# Patient Record
Sex: Male | Born: 1940 | Race: White | Hispanic: No | Marital: Married | Smoking: Former smoker
Health system: Southern US, Community
[De-identification: ages and names within clinical notes are randomized; demographics above are authoritative.]

## PROBLEM LIST (undated history)

## (undated) DIAGNOSIS — M1990 Unspecified osteoarthritis, unspecified site: Secondary | ICD-10-CM

## (undated) DIAGNOSIS — R55 Syncope and collapse: Secondary | ICD-10-CM

## (undated) DIAGNOSIS — M109 Gout, unspecified: Secondary | ICD-10-CM

## (undated) DIAGNOSIS — I471 Supraventricular tachycardia, unspecified: Secondary | ICD-10-CM

## (undated) DIAGNOSIS — I4510 Unspecified right bundle-branch block: Secondary | ICD-10-CM

## (undated) DIAGNOSIS — I480 Paroxysmal atrial fibrillation: Secondary | ICD-10-CM

## (undated) DIAGNOSIS — E7800 Pure hypercholesterolemia, unspecified: Secondary | ICD-10-CM

## (undated) DIAGNOSIS — N400 Enlarged prostate without lower urinary tract symptoms: Secondary | ICD-10-CM

## (undated) DIAGNOSIS — I639 Cerebral infarction, unspecified: Secondary | ICD-10-CM

## (undated) DIAGNOSIS — I10 Essential (primary) hypertension: Secondary | ICD-10-CM

## (undated) DIAGNOSIS — K219 Gastro-esophageal reflux disease without esophagitis: Secondary | ICD-10-CM

## (undated) DIAGNOSIS — I4892 Unspecified atrial flutter: Secondary | ICD-10-CM

## (undated) DIAGNOSIS — G459 Transient cerebral ischemic attack, unspecified: Secondary | ICD-10-CM

## (undated) DIAGNOSIS — K635 Polyp of colon: Secondary | ICD-10-CM

## (undated) HISTORY — DX: Unspecified atrial flutter: I48.92

## (undated) HISTORY — DX: Gastro-esophageal reflux disease without esophagitis: K21.9

## (undated) HISTORY — DX: Supraventricular tachycardia: I47.1

## (undated) HISTORY — DX: Pure hypercholesterolemia, unspecified: E78.00

## (undated) HISTORY — DX: Transient cerebral ischemic attack, unspecified: G45.9

## (undated) HISTORY — PX: CARDIAC CATHETERIZATION: SHX172

## (undated) HISTORY — DX: Supraventricular tachycardia, unspecified: I47.10

## (undated) HISTORY — DX: Unspecified right bundle-branch block: I45.10

## (undated) HISTORY — DX: Paroxysmal atrial fibrillation: I48.0

## (undated) HISTORY — DX: Polyp of colon: K63.5

## (undated) HISTORY — DX: Syncope and collapse: R55

## (undated) HISTORY — DX: Gout, unspecified: M10.9

## (undated) HISTORY — PX: TONSILLECTOMY: SUR1361

---

## 1998-10-31 ENCOUNTER — Ambulatory Visit (HOSPITAL_COMMUNITY): Admission: RE | Admit: 1998-10-31 | Discharge: 1998-10-31 | Payer: Self-pay | Admitting: Gastroenterology

## 2001-04-04 ENCOUNTER — Inpatient Hospital Stay (HOSPITAL_COMMUNITY): Admission: EM | Admit: 2001-04-04 | Discharge: 2001-04-05 | Payer: Self-pay

## 2001-04-04 ENCOUNTER — Emergency Department (HOSPITAL_COMMUNITY): Admission: EM | Admit: 2001-04-04 | Discharge: 2001-04-04 | Payer: Self-pay

## 2001-04-11 ENCOUNTER — Ambulatory Visit (HOSPITAL_COMMUNITY): Admission: RE | Admit: 2001-04-11 | Discharge: 2001-04-11 | Payer: Self-pay | Admitting: Cardiology

## 2003-01-25 ENCOUNTER — Ambulatory Visit (HOSPITAL_COMMUNITY): Admission: RE | Admit: 2003-01-25 | Discharge: 2003-01-25 | Payer: Self-pay | Admitting: Cardiology

## 2004-01-13 ENCOUNTER — Ambulatory Visit (HOSPITAL_COMMUNITY): Admission: RE | Admit: 2004-01-13 | Discharge: 2004-01-13 | Payer: Self-pay | Admitting: Gastroenterology

## 2007-05-04 DIAGNOSIS — I639 Cerebral infarction, unspecified: Secondary | ICD-10-CM

## 2007-05-04 HISTORY — DX: Cerebral infarction, unspecified: I63.9

## 2007-11-07 ENCOUNTER — Ambulatory Visit: Payer: Self-pay | Admitting: Internal Medicine

## 2007-11-07 ENCOUNTER — Encounter: Admission: RE | Admit: 2007-11-07 | Discharge: 2007-11-07 | Payer: Self-pay | Admitting: Family Medicine

## 2007-11-07 ENCOUNTER — Inpatient Hospital Stay (HOSPITAL_COMMUNITY): Admission: EM | Admit: 2007-11-07 | Discharge: 2007-11-09 | Payer: Self-pay | Admitting: Emergency Medicine

## 2007-11-08 ENCOUNTER — Ambulatory Visit: Payer: Self-pay | Admitting: Surgery

## 2007-11-08 ENCOUNTER — Encounter: Payer: Self-pay | Admitting: Internal Medicine

## 2010-09-15 NOTE — H&P (Signed)
NAME:  Logan Aguirre, Logan Aguirre NO.:  [id number]   MEDICAL RECORD NO.:  [id number]          PATIENT TYPE:  INP   LOCATION:  3041                         FACILITY:  [facility]   PHYSICIAN:  Gordy Savers, MDDATE OF BIRTH:  05-Mar-1941   DATE OF ADMISSION:  11/07/2007  DATE OF DISCHARGE:                              HISTORY & PHYSICAL   CHIEF COMPLAINT:  Confusion.   PRESENT ILLNESS:  The patient is a 70 year old gentleman who was stable  until approximately 9:30 a.m. the day of admission at that time when  involved in a school meeting, he became acutely confused.  He apparently  became somewhat unaware of his surroundings and did not realize the  meeting had started.  According to bystanders, he appeared to be quite  confused with normal speech and no focal deficits.  Due to his  confusion, the meeting was terminated early and he was subsequently  evaluated by his primary care Emiel Kielty.  An outpatient head CT was  performed at [facility] that revealed a stroke of indeterminate  age.  Further details of the CT scan are unavailable.  Duration of the  confusion somewhat unclear, but probably lasted less than an hour.  The  patient is now admitted to the hospital for further observation and  evaluation of a suspected acute CVA.   The patient has a history of supraventricular tachycardia since  approximately 1990, and was admitted to hospital 4-5 years ago for  treatment of this tachy arrhythmia.  He has been on 81 mg of aspirin,  but has never been treated with Coumadin anticoagulation.  He is unclear  of any recent 2-D echocardiogram.  He is on verapamil, but he believes  this is for his SVT and not for blood pressure control.   PAST MEDICAL HISTORY:  As mentioned, history of SVT since 1990.  Other problems include.  1. Gastroesophageal reflux disease.  2. He has history of ongoing tobacco use that consist of approximately      2 cigars per week.  He has never  used cigarettes.  3. He was hospitalized approximately 4-5 years ago for management of      SVT.  4. 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   MEDICATIONS:  Medical regimen includes.  1. Verapamil 240 mg daily.  2. Protonix 40 mg daily.  3. Aspirin 81 mg daily.   FAMILY HISTORY:  Father died approximately at age 28, probable history  of colon cancer with history of colostomy.  Mother died in her late 30s.  Details of their health unclear, 3 brothers, 1 is status post CVA, 1  brother died of a self-inflicted gunshot wound.   SOCIAL HISTORY:  The patient is a infrequent cigar smoker as described.  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  He is married, 2 children and 4 grandchildren.   PHYSICAL EXAMINATION:  VITAL SIGNS:  Blood pressure 150/88, pulse 80 and  regular, O2 saturation  100% on supplemental oxygen.  SKIN:  Warm and dry without rash.  HEAD AND NECK EXAM:  Revealed no signs of head trauma.  Pupil responses  were normal.  EAR, NOSE AND THROAT:  Unremarkable.  NECK:  Revealed no bruits.  There is no neck vein distention.  No  adenopathy.  CHEST:  Clear.  CARDIOVASCULAR EXAM:  Revealed normal S1-S2, rhythm was regular.  No  murmurs.  ABDOMEN:  Benign, soft, nontender.  No organomegaly.  EXTREMITIES:  Revealed no edema.  Peripheral pulses intact.  NEUROLOGIC EXAM:  Revealed the patient to be alert, oriented and  appropriate with normal speech.  Cranial nerve examination revealed  normal pupil responses.  Extraocular muscles were full.  There is no  facial asymmetry.  Sensation was intact involving facial areas.  Tongue  and uvula were midline.  Shoulder shrug was normal.  Motor exam revealed  normal grip strength.  There is no drift of the outstretched arms.  Sensory exam was intact to soft touch.  Cerebellar exam revealed normal  ear and nose  testing and heel-to-shin testing.  Gait was normal.  Plantar reflexes were downgoing bilaterally.   IMPRESSION:  1. Acute confusional state with recent cerebrovascular accident.  2. History of paroxysmal supraventricular tachycardia.  3. Gastroesophageal reflux disease.   DISPOSITION:  The patient will be admitted to a telemetry setting and  observed.  He will be maintained on O2.  A brain MRI, carotid Doppler  evaluation and 2-D echocardiogram will be reviewed.  The patient will be  maintained on verapamil, but will consider adding ACE inhibition  depending on his blood pressure. We will check a lipid profile in the  morning and consider a statin therapy.      Gordy Savers, MD  Electronically Signed     PFK/MEDQ  D:  11/07/2007  T:  11/08/2007  Job:  [id number]

## 2010-09-15 NOTE — Discharge Summary (Signed)
NAME:  Logan Aguirre, Logan Aguirre NO.:  [id number]   MEDICAL RECORD NO.:  [id number]          PATIENT TYPE:  INP   LOCATION:  3041                         FACILITY:  [facility]   PHYSICIAN:  Ramiro Harvest, MD    DATE OF BIRTH:  1941-02-18   DATE OF ADMISSION:  11/07/2007  DATE OF DISCHARGE:  11/09/2007                               DISCHARGE SUMMARY   PRIMARY CARE PHYSICIAN:  Jasmine December A. Paulino Rily, MD, of [employer].   CARDIOLOGIST:  Armanda Magic, MD, of [facility].   DISCHARGE DIAGNOSES:  1. Altered mental status likely secondary to a transient ischemic      attack.  2. Supraventricular tachycardia.  3. Gastroesophageal reflux disease.   DISCHARGE MEDICATIONS:  1. Enteric-coated aspirin 325 mg p.o. daily.  2. Verapamil SR 240 mg p.o. daily.  3. Multivitamin daily.  4. Omeprazole 20 mg p.o. daily.   DISPOSITION AND FOLLOWUP:  The patient will be discharged home to follow  up with PCP in 2 weeks.  On followup, the patient will need some risk  factor modification and dietary modification for his LDL of 117.  The  patient will need his cholesterol reassessed in a few weeks per PCP.   CONSULTANTS:  None.   PROCEDURES PERFORMED:  1. A CT of the head with and without contrast was performed on November 07, 2007 that showed age-indeterminate lacunar infarct in the anterior      limb of the right internal capsule, otherwise normal noncontrast      appearance of the brain for age 70.  2. Chest x-ray obtained November 07, 2007 showed linear atelectasis with      scarring in the right middle lobe, no acute cardiopulmonary      disease, otherwise.  3. MRI and MRA of the head obtained on November 08, 2007 also showed normal      except for an old lacunar stroke in the anterior limb internal      capsule on the right.  No sign of acute or subacute lesion.  Normal      intracranial MR angiography of the large and medium-sized vessels.  4. A 2-D echo was obtained on November 08, 2007 that showed  overall left      ventricular systolic function was normal, EF 60-65%, no left      ventricular regional wall motion abnormalities, left ventricular      wall thickness was mildly increased, Doppler parameters consistent      with abnormal left ventricular relaxation.  His left ventricular      filling pattern was also seen with age, and no obvious      cardioembolic source.  Consider TEE if clinically warranted.  5. Carotid Dopplers were obtained on November 08, 2007 with preliminary      study showing bilaterally no significant plaque visualized.  No ICA      stenosis.   BRIEF HISTORY AND ADMITTING HISTORY AND PHYSICAL:  Mr. Palladino is a 70-  year-old gentleman who was stable until approximately 9:30 a.m. on the  day of admission.  At that time,  the patient was involved in a school  meeting, and he became acutely confused, he apparently became unaware of  his surroundings, and did not realize the meeting had started.  According to bystanders, he appeared to be quite confused with normal  speech and no focal deficits.  Due to his confusion, the meeting was  terminated early.  He was subsequently evaluated by his PCP.  An  outpatient head CT was performed at [facility] that revealed a  stroke of indeterminate age.  Further details of the CT scan were  unavailable at the time of admission.  The duration of confusion was  somewhat unclear, it probably lasted less than an hour.  The patient was  now admitted to the hospital for further observation and evaluation of a  suspected acute CVA.  The patient has a history of supraventricular  tachycardia since the 1990, and was admitted to the hospital 4-5 years  ago for treatment of his tachyarrhythmia.  The patient has been on 81 mg  of aspirin, but has never been treated with Coumadin anticoagulation.  He is unclear of any recent 2-D echo done.  The patient is on verapamil,  but believes this was for his SVT and not for blood pressure  control.   PHYSICAL EXAMINATION:  VITAL SIGNS:  Per admitting physician, blood  pressure 150/88, pulse of 80, and sating 100% on 2 L nasal cannula.  SKIN:  Warm and dry without a rash.  HEENT:  Normocephalic and atraumatic.  Pupils equal, round, and reactive  to light.  Extraocular movements intact.  Oropharynx was clear.  No  lesions.  No exudates.  NECK:  Supple.  No lymphadenopathy.  No bruits.  No neck vein  distention.  No adenopathy.  RESPIRATORY:  Lungs were clear to auscultation bilaterally.  No wheezes  and no rhonchi.  CARDIOVASCULAR:  Normal S1 and S2.  Rhythm was regular.  No murmurs.  ABDOMEN:  Soft, nontender, nondistended, and benign.  No organomegaly.  EXTREMITIES:  No edema.  Peripheral pulses were intact.  NEUROLOGICAL:  The patient was alert and oriented x3 with normal speech.  Cranial nerve exam revealed normal pupil response.  Extraocular muscles  were full.  No facial asymmetry.  Sensation was intact involving facial  areas.  Tongue and uvula were midline.  Shoulder shrug was normal.  Motor exam revealed normal grip strength.  No drift of outstretched  arms.  Sensory examination was intact to soft touch.  Cerebellar exam  revealed normal ear and nose testing and heel-to-shin testing.  Gait was  normal.  Plantar reflexes were downgoing bilaterally.   HOSPITAL COURSE:  Altered mental status likely secondary to a transient  ischemic attack.  The patient was admitted for a stroke workup secondary  to the indeterminate age found on the outpatient CT.  The patient was  maintained on oxygen.  Cardiac enzymes were cycled, which came back  negative.  Urinalysis was obtained, which was negative for any source of  infection.  Carotid Dopplers were obtained as stated above.  MRI and MRA  of the head was obtained, which was negative for any acute infarct.  A 2-  D echo was also obtained with normal EF and no cardioembolic source.  RPR was obtained, which was nonreactive.  B12  levels were also obtained,  which were within normal limits.  By the time the patient was admitted,  the patient was back to baseline.  The patient remained asymptomatic  throughout the hospitalization.  The patient was initially placed on  Aggrenox during the workup of his TIA.  The patient's Aggrenox will be  discontinued.  The patient will be placed on full-dose enteric-coated  aspirin 325 mg daily.  A fasting lipid panel was also obtained during  the hospitalization, which showed a total cholesterol level of 173,  triglycerides of 144, HDL of 27, and LDL of 117.  PTT of 30, PT of 13.8,  and INR of 1.0.  The patient remained stable, and back to his baseline,  improved condition by the day of discharge.  The patient will be  discharged home in stable and improved condition with risk factor  modification and to be started on enteric-coated aspirin 325 mg daily.   LABORATORY DATA:  Labs on the day of discharge, sodium 137, potassium  3.6, chloride 104, bicarb 24, BUN 16, creatinine 0.93, glucose of 114,  and a calcium of 8.9.   The patient will be discharged in stable and improved condition to  follow up with PCP as stated above.   It was a pleasure taking care of Mr. Emilian Stawicki.      Ramiro Harvest, MD  Electronically Signed     DT/MEDQ  D:  11/09/2007  T:  11/10/2007  Job:  [id number]   cc:   Emeterio Reeve, MD

## 2010-09-18 NOTE — Consult Note (Signed)
. [facility]  Patient:    Logan Aguirre, Logan Aguirre Visit Number: [id number] MRN: [id number]          Service Type: MED Location: 2000 2001 01 Attending Physician:  Rosanne Sack Dictated by:   Armanda Magic, M.D. Proc. Date: 04/05/01 Admit Date:  04/04/2001 Discharge Date: 04/05/2001   CC:         Logan Aguirre, M.D.   Consultation Report  CHIEF COMPLAINT:  Supraventricular tachycardia.  HISTORY OF PRESENT ILLNESS:  This is a 70 year old white male with a history of supraventricular tachycardia for greater than 20 years currently not on any medications. The supraventricular tachycardia was always terminated with coughing or taking a cold shower. He has had a URI and was taking Sudafed for the last the several days and developed sudden onset of palpitations at 9:15 a.m. on April 04, 2001. He went to the emergency room where he was in supraventricular tachycardia. He was given adenosine and propranolol and sent home after termination of the rhythm. At 5 p.m., yesterday afternoon, he had recurrent supraventricular tachycardia and went to the emergency room. He received adenosine 6 and 12 mg with conversion to sinus rhythm. He did have a very, very faint sensation of chest pressure which he always gets with his supraventricular tachycardia. There is no history of shortness of breath, nausea, vomiting, fever, or chills.  PAST MEDICAL HISTORY/PAST SURGICAL HISTORY:  Significant for supraventricular tachycardia on no medications for 20 years, tonsillectomy, fracture of transverse process in lumbar spine, prostatitis, gastroesophageal reflux.  ALLERGIES:  AMOXICILLIN, he gets hives.  MEDICATIONS:  Prilosec 20 mg a day.  FAMILY HISTORY:  Father died of emphysema. Mother died of multiple myeloma.  SOCIAL HISTORY:  He is married. No tobacco. Occasional alcohol use. Works at [employer].  REVIEW OF SYSTEMS:  Otherwise what is ever stated  in the HPI. Otherwise negative.  PHYSICAL EXAMINATION:  VITAL SIGNS:  Blood pressure is 138/76, heart rate is 70, respiratory rate 20.  HEENT:  Benign.  NECK:  Supple. No lymphadenopathy. No carotid bruits. Good carotid upstrokes.  LUNGS:  Clear to auscultation bilaterally.  HEART:  Regular rate and rhythm. No murmurs, rubs, or gallops. Normal S1, S2.  ABDOMEN:  Soft, nontender, nondistended. Positive bowel sounds. No hepatosplenomegaly. No pulsatile masses.  EXTREMITIES:  No cyanosis, erythema, or edema, +2 dorsalis pedis pulses bilaterally.  LABORATORY DATA:  EKG shows sinus tachycardia with right bundle branch block. Telemetry shows supraventricular tachycardia at 169 beats per minute which terminated to normal sinus rhythm.  CPK 303, 362, 258. MB is 4.3, 4.1, and 3.3. Relative index 1.4 and 1.1. All of his relative indexes were normal. Troponin 0.1, 0.03, and 0.06. White cell count 12.1, hematocrit 49.5, hemoglobin 17.1, platelet count 244. Sodium 140, potassium 4.5, chloride 110, bicarb 29, BUN 14, creatinine 1.2, glucose 119, phosphorus 4.1, magnesium 2.2.  ASSESSMENT: 1. Supraventricular tachycardia with long history of supraventricular    tachycardia on no medications. Probably triggered secondary to recent URI    with use of Sudafed. 2. Mildly elevated troponin on initial presentation. CPK is slightly elevated    and slightly elevated MB, but relative indexes are all normal. This is    consistent with tachycardia-induced enzymes leak and does not represent    underlying myocardial infarction. He has no cardiac risk factors. 3. Right bundle branch block. 4. Mild chest pressure. Again, which occurs every time he has supraventricular    tachycardia. He has no cardiac risk factors for  underlying coronary    disease.  PLAN: 1. A 2-D echocardiogram to rule out structural heart disease. 2. Adenosine Cardiolite to rule out possible underlying ischemia. The patient     wishes to have this done at home due to weather, and I think this is fine.    I think that the very mild troponin leak on the first set of enzymes    represents enzyme leak from tachycardia and not coronary ischemia. 3. Start Cardizem CD 180 mg a day. 4. Check thyroid function test. 5. Follow up with Armanda Magic, M.D. in two to three weeks. Dictated by:   Armanda Magic, M.D. Attending Physician:  Rosanne Sack DD:  04/05/01 TD:  04/05/01 Job: [id number]

## 2010-09-18 NOTE — Cardiovascular Report (Signed)
NAME:  Logan Aguirre, Logan Aguirre                        ACCOUNT NO.:  [id number]   MEDICAL RECORD NO.:  [id number]                   PATIENT TYPE:  OIB   LOCATION:  2855                                 FACILITY:  [facility]   PHYSICIAN:  Armanda Magic, M.D.                  DATE OF BIRTH:  03-23-1941   DATE OF PROCEDURE:  01/25/2003  DATE OF DISCHARGE:                              CARDIAC CATHETERIZATION   REFERRING PHYSICIAN:  Duncan Dull, M.D.   PROCEDURE:  1. Left heart catheterization.  2. Coronary angiography.  3. Left ventriculography.   OPERATOR:  Armanda Magic, M.D.   INDICATIONS:  Chest pain, shortness of breath, abnormal Cardiolite.   COMPLICATIONS:  None.   IV ACCESS:  Via right femoral artery 6-French sheath.   HISTORY:  This is a very pleasant 70 year old white male with a history of  SVT who had an episode of exertional shortness of breath and chest  discomfort.  He underwent stress Cardiolite study which revealed a partially  reversible inferior wall defect.  We had a discussion as to whether this may  be diaphragmatic attenuation versus underlying coronary disease and we are  now proceeding with heart catheterization.   PROCEDURE:  The patient is brought to the cardiac catheterization laboratory  in the fasting, nonsedated state.  Informed consent was obtained.  The  patient was connected to continuous heart rate and pulse oximetry  monitoring, intermittent blood pressure monitoring.  The right groin was  prepped and draped in the sterile fashion.  1% Xylocaine was used for local  anesthesia.  Using the modified Seldinger technique a 6-French sheath was  placed in the right femoral artery.  Under fluoroscopic guide a 6-French JL4  catheter was placed in the left coronary artery.  Multiple cineangiographic  films were taken in 30 degree RAO, 40 degree LAO views.  This catheter was  then exchanged out over a guidewire for a 6-French JR4 catheter which was  placed under  fluoroscopic guidance into the right coronary artery.  Multiple  cineangiographic films were taken in 30 degree RAO, 40 degree LAO views.  This catheter was then exchanged out over a guidewire for a 6-French angled  pigtail catheter which was placed under fluoroscopic guidance into the left  ventricular cavity.  Left ventriculography performed in a 30 degree RAO view  using a total of 30 mL of contrast at 13 mL/second showed normal LV systolic  function, no regional wall motion abnormalities.  The catheter was then  pulled back across the aortic valve with no significant gradient noted.  At  the end of procedure all catheters and sheaths were removed.  Manual  compression was performed until adequate hemostasis was obtained.  The  patient was transferred back to room in stable condition.   RESULTS:  Left main coronary artery is widely patent and trifurcates into a  left circumflex artery, ramus branch, and LAD.  The left circumflex artery is widely patent throughout its course and gives  rise to one large obtuse marginal branch which is widely patent.  The rest  of the left circumflex traverses the AV groove.   The ramus branch is a very large vessel as well and traverses the apex.  It  is widely patent throughout its course.   The LAD is widely patent throughout its proximal mid and distal portions and  extends to the apex.  It gives rise to two diagonal branches, both of which  are widely patent.   The right coronary artery gives rise in the mid portion to a large acute  marginal branch which is widely patent.  The RCA is widely patent throughout  its course to the inferior wall and bifurcates into a posterior descending  artery and posterolateral artery both of which are widely patent.   LEFT VENTRICULOGRAM:  Left ventriculography performed in the 30 degree RAO  view using a total of 30 mL of contrast at 13 mL/second showed normal LV  systolic function, EF 65%.  Aortic pressure  133/78 mmHg.  LV pressure 138/13  mmHg.  LVEDP 21 mmHg.   ASSESSMENT:  1. Noncardiac chest pain.  2. Normal coronary arteries.  3. Normal left ventricular function.  4. Supraventricular tachycardia which is stable.   PLAN:  Discharge to home after IV fluids and bed rest.  Follow up with Armanda Magic, M.D. in two to three weeks.                                               Armanda Magic, M.D.    TT/MEDQ  D:  01/25/2003  T:  01/25/2003  Job:  [id number]   cc:   Duncan Dull, M.D.  [address]  [place]  [place] [address]  Fax: [phone]

## 2010-09-18 NOTE — Op Note (Signed)
NAME:  NAZIER, NEYHART                        ACCOUNT NO.:  [id number]   MEDICAL RECORD NO.:  [id number]                   PATIENT TYPE:  AMB   LOCATION:  ENDO                                 FACILITY:  [facility]   PHYSICIAN:  James L. Malon Kindle., M.D.          DATE OF BIRTH:  12-29-1940   DATE OF PROCEDURE:  01/13/2004  DATE OF DISCHARGE:                                 OPERATIVE REPORT   PROCEDURE:  Colonoscopy.   ENDOSCOPIST:  Llana Aliment. Randa Evens, M.D.   MEDICAL DECISION MAKING:  Fentanyl 100 mcg, Versed 8 mg IV.   SCOPE:  Olympus pediatric adjustable colonoscope.   DESCRIPTION OF PROCEDURE:  The procedure had been explained to the patient  and a consent obtained.  With the patient in the left lateral decubitus  position, the Olympus pediatric adjustable colonoscope was inserted and  advanced under direct visualization.  The prep was excellent.  I was able to  reach the cecum without difficulty.  The ileocecal valve and appendiceal  orifice were seen.  The scope was withdrawn, and the cecum, ascending colon,  hepatic flexure, transverse colon, splenic flexure, descending and sigmoid  colon were seen well.  They were free of polyps or other lesions.  The scope  was withdrawn.  The patient tolerated the procedure well.   ASSESSMENT:  Strong family history of colon cancer, with negative  colonoscopy at this time - code #V16.0.   PLAN:  Will recommend yearly Hemoccult and repeat the procedure in five  years.                                               James L. Malon Kindle., M.D.    Waldron Session  D:  01/13/2004  T:  01/13/2004  Job:  [id number]   cc:   Duncan Dull, M.D.  [address]  [place]  [place] [address]  Fax: [phone]

## 2011-01-28 LAB — PROTIME-INR
INR: 1
Prothrombin Time: 13.8

## 2011-01-28 LAB — COMPREHENSIVE METABOLIC PANEL
BUN: 15
Calcium: 8.8
Glucose, Bld: 95
Total Protein: 6.2

## 2011-01-28 LAB — BASIC METABOLIC PANEL
BUN: 16
CO2: 24
Calcium: 8.9
Creatinine, Ser: 0.93
GFR calc non Af Amer: >60
Glucose, Bld: 114 — ABNORMAL HIGH
Sodium: 137

## 2011-01-28 LAB — CBC
HCT: 44.2
HCT: 46.2
Hemoglobin: 15.3
Hemoglobin: 16.2
MCHC: 34.5
MCHC: 35.1
MCV: 91.3
MCV: 92.3
Platelets: 197
RDW: 13
RDW: 13.2

## 2011-01-28 LAB — LIPID PANEL
LDL Cholesterol: 117 — ABNORMAL HIGH
VLDL: 29

## 2011-01-28 LAB — CARDIAC PANEL(CRET KIN+CKTOT+MB+TROPI)
CK, MB: 2.2
Total CK: 185
Troponin I: <0.01

## 2011-01-28 LAB — URINALYSIS, ROUTINE W REFLEX MICROSCOPIC
Glucose, UA: NEGATIVE
Hgb urine dipstick: NEGATIVE
Specific Gravity, Urine: 1.038 — ABNORMAL HIGH
Urobilinogen, UA: 1

## 2011-01-28 LAB — RPR: RPR Ser Ql: NONREACTIVE

## 2011-01-28 LAB — DIFFERENTIAL
Basophils Relative: 0
Lymphs Abs: 2.2
Monocytes Relative: 5
Neutro Abs: 5.9
Neutrophils Relative %: 69

## 2011-08-04 DIAGNOSIS — H2510 Age-related nuclear cataract, unspecified eye: Secondary | ICD-10-CM | POA: Diagnosis not present

## 2011-09-30 DIAGNOSIS — Z79899 Other long term (current) drug therapy: Secondary | ICD-10-CM | POA: Diagnosis not present

## 2011-09-30 DIAGNOSIS — Z23 Encounter for immunization: Secondary | ICD-10-CM | POA: Diagnosis not present

## 2011-09-30 DIAGNOSIS — E7800 Pure hypercholesterolemia, unspecified: Secondary | ICD-10-CM | POA: Diagnosis not present

## 2011-09-30 DIAGNOSIS — L989 Disorder of the skin and subcutaneous tissue, unspecified: Secondary | ICD-10-CM | POA: Diagnosis not present

## 2011-09-30 DIAGNOSIS — Z0000 Encounter for general adult medical examination without abnormal findings: Secondary | ICD-10-CM | POA: Diagnosis not present

## 2011-09-30 DIAGNOSIS — K219 Gastro-esophageal reflux disease without esophagitis: Secondary | ICD-10-CM | POA: Diagnosis not present

## 2012-01-22 DIAGNOSIS — Z23 Encounter for immunization: Secondary | ICD-10-CM | POA: Diagnosis not present

## 2012-02-21 DIAGNOSIS — G319 Degenerative disease of nervous system, unspecified: Secondary | ICD-10-CM | POA: Diagnosis not present

## 2012-02-21 DIAGNOSIS — Z8673 Personal history of transient ischemic attack (TIA), and cerebral infarction without residual deficits: Secondary | ICD-10-CM | POA: Diagnosis not present

## 2012-02-21 DIAGNOSIS — R404 Transient alteration of awareness: Secondary | ICD-10-CM | POA: Diagnosis not present

## 2012-02-21 DIAGNOSIS — F4489 Other dissociative and conversion disorders: Secondary | ICD-10-CM | POA: Diagnosis not present

## 2012-02-21 DIAGNOSIS — R0602 Shortness of breath: Secondary | ICD-10-CM | POA: Diagnosis not present

## 2012-02-21 DIAGNOSIS — I6529 Occlusion and stenosis of unspecified carotid artery: Secondary | ICD-10-CM | POA: Diagnosis not present

## 2012-02-21 DIAGNOSIS — I6789 Other cerebrovascular disease: Secondary | ICD-10-CM | POA: Diagnosis not present

## 2012-02-21 DIAGNOSIS — R6889 Other general symptoms and signs: Secondary | ICD-10-CM | POA: Diagnosis not present

## 2012-02-23 DIAGNOSIS — F411 Generalized anxiety disorder: Secondary | ICD-10-CM | POA: Diagnosis not present

## 2012-02-23 DIAGNOSIS — F29 Unspecified psychosis not due to a substance or known physiological condition: Secondary | ICD-10-CM | POA: Diagnosis not present

## 2012-03-01 DIAGNOSIS — I10 Essential (primary) hypertension: Secondary | ICD-10-CM | POA: Diagnosis not present

## 2012-03-01 DIAGNOSIS — F29 Unspecified psychosis not due to a substance or known physiological condition: Secondary | ICD-10-CM | POA: Diagnosis not present

## 2012-03-01 DIAGNOSIS — E785 Hyperlipidemia, unspecified: Secondary | ICD-10-CM | POA: Diagnosis not present

## 2012-03-01 DIAGNOSIS — Z8679 Personal history of other diseases of the circulatory system: Secondary | ICD-10-CM | POA: Diagnosis not present

## 2012-03-03 DIAGNOSIS — R972 Elevated prostate specific antigen [PSA]: Secondary | ICD-10-CM | POA: Diagnosis not present

## 2012-03-10 DIAGNOSIS — R972 Elevated prostate specific antigen [PSA]: Secondary | ICD-10-CM | POA: Diagnosis not present

## 2012-03-10 DIAGNOSIS — N401 Enlarged prostate with lower urinary tract symptoms: Secondary | ICD-10-CM | POA: Diagnosis not present

## 2012-03-14 DIAGNOSIS — Z79899 Other long term (current) drug therapy: Secondary | ICD-10-CM | POA: Diagnosis not present

## 2012-03-14 DIAGNOSIS — I471 Supraventricular tachycardia: Secondary | ICD-10-CM | POA: Diagnosis not present

## 2012-04-28 DIAGNOSIS — F29 Unspecified psychosis not due to a substance or known physiological condition: Secondary | ICD-10-CM | POA: Diagnosis not present

## 2012-04-28 DIAGNOSIS — I10 Essential (primary) hypertension: Secondary | ICD-10-CM | POA: Diagnosis not present

## 2012-04-28 DIAGNOSIS — E785 Hyperlipidemia, unspecified: Secondary | ICD-10-CM | POA: Diagnosis not present

## 2012-10-19 DIAGNOSIS — L723 Sebaceous cyst: Secondary | ICD-10-CM | POA: Diagnosis not present

## 2013-01-11 DIAGNOSIS — Z23 Encounter for immunization: Secondary | ICD-10-CM | POA: Diagnosis not present

## 2013-03-10 ENCOUNTER — Encounter: Payer: Self-pay | Admitting: Cardiology

## 2013-03-14 ENCOUNTER — Ambulatory Visit (INDEPENDENT_AMBULATORY_CARE_PROVIDER_SITE_OTHER): Payer: Medicare Other | Admitting: Cardiology

## 2013-03-14 ENCOUNTER — Encounter: Payer: Self-pay | Admitting: Cardiology

## 2013-03-14 VITALS — BP 120/78 | HR 74 | Ht 76.0 in | Wt 229.0 lb

## 2013-03-14 DIAGNOSIS — I4510 Unspecified right bundle-branch block: Secondary | ICD-10-CM

## 2013-03-14 DIAGNOSIS — E7800 Pure hypercholesterolemia, unspecified: Secondary | ICD-10-CM | POA: Insufficient documentation

## 2013-03-14 DIAGNOSIS — R339 Retention of urine, unspecified: Secondary | ICD-10-CM | POA: Diagnosis not present

## 2013-03-14 DIAGNOSIS — N139 Obstructive and reflux uropathy, unspecified: Secondary | ICD-10-CM | POA: Diagnosis not present

## 2013-03-14 DIAGNOSIS — R55 Syncope and collapse: Secondary | ICD-10-CM | POA: Insufficient documentation

## 2013-03-14 DIAGNOSIS — I471 Supraventricular tachycardia: Secondary | ICD-10-CM | POA: Diagnosis not present

## 2013-03-14 DIAGNOSIS — K219 Gastro-esophageal reflux disease without esophagitis: Secondary | ICD-10-CM | POA: Insufficient documentation

## 2013-03-14 DIAGNOSIS — N401 Enlarged prostate with lower urinary tract symptoms: Secondary | ICD-10-CM | POA: Diagnosis not present

## 2013-03-14 DIAGNOSIS — R972 Elevated prostate specific antigen [PSA]: Secondary | ICD-10-CM | POA: Diagnosis not present

## 2013-03-14 NOTE — Progress Notes (Signed)
  [address] [place], [place]  [address] Phone: [phone] Fax:  [phone]  Date:  03/14/2013   ID:  Logan Aguirre, Logan Aguirre 07/03/1940, MRN [id number]  PCP:  No primary provider on file.  Cardiologist:  Armanda Magic, MD   CC:  Annual followup of SVT and RBBB   History of Present Illness: Logan Aguirre is a 72 y.o. male with a history of chronic RBBB and SVT who presents today for followup.  He is doing well.  He denies any chest pain, DOE, SOB, LE edema, palpitations, dizziness or syncope.     Wt Readings from Last 3 Encounters:  03/14/13 229 lb (103.874 kg)     Past Medical History  Diagnosis Date  . GERD (gastroesophageal reflux disease)   . Gout   . Colon polyp   . 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 Paroxysmal supraventricular tachycardia     Dr Mayford Knife  . Right bundle branch block     normal coronary arteries   . Pure hypercholesterolemia   . Syncope and collapse 2009/2010    ? TIA    Current Outpatient Prescriptions  Medication Sig Dispense Refill  . aspirin EC 325 MG tablet Take 325 mg by mouth daily.      Marland Kitchen atorvastatin (LIPITOR) 10 MG tablet Take 10 mg by mouth daily.      . colchicine 0.6 MG tablet Take 0.6 mg by mouth. As needed      . Multiple Vitamin (MULTIVITAMIN) capsule Take 1 capsule by mouth daily.      Marland Kitchen omeprazole (PRILOSEC) 20 MG capsule Take 20 mg by mouth daily.      . verapamil (CALAN-SR) 240 MG CR tablet Take 240 mg by mouth at bedtime.       No current facility-administered medications for this visit.    Allergies:    Allergies  Allergen Reactions  . Amoxicillin     rash  . Shrimp [Shellfish Allergy]     Hives   . Zantac [Ranitidine Hcl]     hives    Social History:  The patient  reports that he has been smoking Cigars.  He does not have any smokeless tobacco history on file. He reports that he drinks about 1.8 ounces of alcohol per week. He reports that he does not use illicit drugs.    Family History:  The patient's family history includes COPD in his father; CVA in his brother; Colon cancer in his father; Multiple myeloma in his mother; Suicidality in his brother.   ROS:  Please see the history of present illness.      All other systems reviewed and negative.   PHYSICAL EXAM: VS:  BP 120/78  Pulse 74  Ht 6\' 4"  (1.93 m)  Wt 229 lb (103.874 kg)  BMI 27.89 kg/m2 Well nourished, well developed, in no acute distress HEENT: normal Neck: no JVD Cardiac:  normal S1, S2; RRR; no murmur Lungs:  clear to auscultation bilaterally, no wheezing, rhonchi or rales Abd: soft, nontender, no hepatomegaly Ext: no edema Skin: warm and dry Neuro:  CNs 2-12 intact, no focal abnormalities noted  EKG:  NSR with RBBB     ASSESSMENT AND PLAN:  1. SVT with no reoccurence  - continue Verapamil 2.  Chronic RBBB  Followup with me in 1 year  Signed, Armanda Magic, MD 03/14/2013 9:21 AM

## 2013-03-14 NOTE — Patient Instructions (Signed)
Your physician recommends that you continue on your current medications as directed. Please refer to the Current Medication list given to you today.  Your physician wants you to follow-up in: 1 Year with Dr Turner You will receive a reminder letter in the mail two months in advance. If you don't receive a letter, please call our office to schedule the follow-up appointment.  

## 2013-03-22 DIAGNOSIS — R339 Retention of urine, unspecified: Secondary | ICD-10-CM | POA: Diagnosis not present

## 2013-04-07 DIAGNOSIS — L509 Urticaria, unspecified: Secondary | ICD-10-CM | POA: Diagnosis not present

## 2013-04-07 DIAGNOSIS — R21 Rash and other nonspecific skin eruption: Secondary | ICD-10-CM | POA: Diagnosis not present

## 2013-04-07 DIAGNOSIS — Z8673 Personal history of transient ischemic attack (TIA), and cerebral infarction without residual deficits: Secondary | ICD-10-CM | POA: Diagnosis not present

## 2013-04-07 DIAGNOSIS — F17200 Nicotine dependence, unspecified, uncomplicated: Secondary | ICD-10-CM | POA: Diagnosis not present

## 2013-04-07 DIAGNOSIS — R209 Unspecified disturbances of skin sensation: Secondary | ICD-10-CM | POA: Diagnosis not present

## 2013-04-07 DIAGNOSIS — R5381 Other malaise: Secondary | ICD-10-CM | POA: Diagnosis not present

## 2013-04-13 ENCOUNTER — Other Ambulatory Visit: Payer: Self-pay | Admitting: General Surgery

## 2013-04-13 ENCOUNTER — Telehealth: Payer: Self-pay | Admitting: Cardiology

## 2013-04-13 MED ORDER — VERAPAMIL HCL ER 240 MG PO CP24: 240.0000 mg | ORAL_CAPSULE | Freq: Every day | ORAL | Status: DC

## 2013-04-13 NOTE — Telephone Encounter (Signed)
Walk in pt Form " can he take Verapamil" gave to [facility]

## 2013-05-08 DIAGNOSIS — J019 Acute sinusitis, unspecified: Secondary | ICD-10-CM | POA: Diagnosis not present

## 2013-05-09 ENCOUNTER — Other Ambulatory Visit: Payer: Self-pay | Admitting: General Surgery

## 2013-05-09 MED ORDER — VERAPAMIL HCL ER 240 MG PO TBCR: 240.0000 mg | EXTENDED_RELEASE_TABLET | Freq: Every day | ORAL | Status: DC

## 2013-10-03 DIAGNOSIS — M25529 Pain in unspecified elbow: Secondary | ICD-10-CM | POA: Diagnosis not present

## 2013-10-04 ENCOUNTER — Ambulatory Visit
Admission: RE | Admit: 2013-10-04 | Discharge: 2013-10-04 | Disposition: A | Discharge status: Home / Self Care | Payer: Medicare Other | Source: Ambulatory Visit | Attending: Family Medicine | Admitting: Family Medicine

## 2013-10-04 ENCOUNTER — Other Ambulatory Visit: Payer: Self-pay | Admitting: Family Medicine

## 2013-10-04 DIAGNOSIS — M25521 Pain in right elbow: Secondary | ICD-10-CM

## 2013-10-04 DIAGNOSIS — M25529 Pain in unspecified elbow: Secondary | ICD-10-CM | POA: Diagnosis not present

## 2013-10-04 DIAGNOSIS — M7989 Other specified soft tissue disorders: Secondary | ICD-10-CM | POA: Diagnosis not present

## 2013-10-05 DIAGNOSIS — N401 Enlarged prostate with lower urinary tract symptoms: Secondary | ICD-10-CM | POA: Diagnosis not present

## 2013-10-22 DIAGNOSIS — Z0000 Encounter for general adult medical examination without abnormal findings: Secondary | ICD-10-CM | POA: Diagnosis not present

## 2013-10-22 DIAGNOSIS — Z23 Encounter for immunization: Secondary | ICD-10-CM | POA: Diagnosis not present

## 2013-10-22 DIAGNOSIS — Z79899 Other long term (current) drug therapy: Secondary | ICD-10-CM | POA: Diagnosis not present

## 2013-10-22 DIAGNOSIS — K219 Gastro-esophageal reflux disease without esophagitis: Secondary | ICD-10-CM | POA: Diagnosis not present

## 2013-10-22 DIAGNOSIS — E7800 Pure hypercholesterolemia, unspecified: Secondary | ICD-10-CM | POA: Diagnosis not present

## 2013-10-22 DIAGNOSIS — I471 Supraventricular tachycardia: Secondary | ICD-10-CM | POA: Diagnosis not present

## 2013-10-22 DIAGNOSIS — Z131 Encounter for screening for diabetes mellitus: Secondary | ICD-10-CM | POA: Diagnosis not present

## 2013-11-14 DIAGNOSIS — M25529 Pain in unspecified elbow: Secondary | ICD-10-CM | POA: Diagnosis not present

## 2014-01-19 DIAGNOSIS — Z23 Encounter for immunization: Secondary | ICD-10-CM | POA: Diagnosis not present

## 2014-02-06 ENCOUNTER — Other Ambulatory Visit: Payer: Self-pay | Admitting: Gastroenterology

## 2014-02-06 DIAGNOSIS — K648 Other hemorrhoids: Secondary | ICD-10-CM | POA: Diagnosis not present

## 2014-02-06 DIAGNOSIS — Z09 Encounter for follow-up examination after completed treatment for conditions other than malignant neoplasm: Secondary | ICD-10-CM | POA: Diagnosis not present

## 2014-02-06 DIAGNOSIS — Z86010 Personal history of colonic polyps: Secondary | ICD-10-CM | POA: Diagnosis not present

## 2014-02-06 DIAGNOSIS — K621 Rectal polyp: Secondary | ICD-10-CM | POA: Diagnosis not present

## 2014-03-11 ENCOUNTER — Ambulatory Visit (INDEPENDENT_AMBULATORY_CARE_PROVIDER_SITE_OTHER): Payer: Medicare Other | Admitting: Cardiology

## 2014-03-11 ENCOUNTER — Encounter: Payer: Self-pay | Admitting: Cardiology

## 2014-03-11 VITALS — BP 116/78 | HR 78 | Ht 75.5 in | Wt 227.0 lb

## 2014-03-11 DIAGNOSIS — I471 Supraventricular tachycardia: Secondary | ICD-10-CM | POA: Diagnosis not present

## 2014-03-11 DIAGNOSIS — T733XXA Exhaustion due to excessive exertion, initial encounter: Secondary | ICD-10-CM | POA: Diagnosis not present

## 2014-03-11 DIAGNOSIS — I4510 Unspecified right bundle-branch block: Secondary | ICD-10-CM | POA: Diagnosis not present

## 2014-03-11 NOTE — Patient Instructions (Signed)
Your physician recommends that you continue on your current medications as directed. Please refer to the Current Medication list given to you today.  Your physician has requested that you have a lexiscan myoview. For further information please visit [email or web address]. Please follow instruction sheet, as given.  Your physician wants you to follow-up in: 1 year with Dr. Radford Pax.  You will receive a reminder letter in the mail two months in advance. If you don't receive a letter, please call our office to schedule the follow-up appointment.

## 2014-03-11 NOTE — Progress Notes (Signed)
[address], [place], [place]  [address] Phone: [phone] Fax:  [phone]  Date:  03/11/2014   ID:  Logan, Aguirre 1940-08-11, MRN [id number]  PCP:  No primary care provider on file.  Cardiologist:  Fransico Him, MD    History of Present Illness: Logan Aguirre is a 73 y.o. male with a history of chronic RBBB and SVT who presents today for followup. He is doing well. He denies any chest pain, DOE, SOB, LE edema, palpitations, dizziness or syncope. His only complaint is that he wears out easy with exertion.  He now has a harder time doing housework without getting worn out.    Wt Readings from Last 3 Encounters:  03/11/14 227 lb (102.967 kg)  03/14/13 229 lb (103.874 kg)     Past Medical History  Diagnosis Date  . GERD (gastroesophageal reflux disease)   . Gout   . Colon polyp   . 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 Paroxysmal supraventricular tachycardia     Dr Radford Pax  . Right bundle branch block     normal coronary arteries   . Pure hypercholesterolemia   . Syncope and collapse 2009/2010    ? TIA    Current Outpatient Prescriptions  Medication Sig Dispense Refill  . aspirin EC 325 MG tablet Take 325 mg by mouth daily.    Marland Kitchen atorvastatin (LIPITOR) 10 MG tablet Take 10 mg by mouth daily.    . colchicine 0.6 MG tablet Take 0.6 mg by mouth daily as needed (gout).     . finasteride (PROSCAR) 5 MG tablet Take 1 tablet by mouth daily.  3  . Multiple Vitamin (MULTIVITAMIN) capsule Take 1 capsule by mouth daily.    Marland Kitchen omeprazole (PRILOSEC) 20 MG capsule Take 20 mg by mouth daily.    . tamsulosin (FLOMAX) 0.4 MG CAPS capsule Take 1 capsule by mouth daily.  3  . verapamil (CALAN-SR) 240 MG CR tablet Take 1 tablet (240 mg total) by mouth at bedtime. 90 tablet 3   No current facility-administered medications for this visit.    Allergies:    Allergies  Allergen Reactions  . Amoxicillin     rash  . Shrimp [Shellfish Allergy]       Hives   . Zantac [Ranitidine Hcl]     hives    Social History:  The patient  reports that he has been smoking Cigars.  He does not have any smokeless tobacco history on file. He reports that he drinks about 1.8 oz of alcohol per week. He reports that he does not use illicit drugs.   Family History:  The patient's family history includes COPD in his father; CVA in his brother; Colon cancer in his father; Multiple myeloma in his mother; Suicidality in his brother.   ROS:  Please see the history of present illness.      All other systems reviewed and negative.   PHYSICAL EXAM: VS:  BP 116/78 mmHg  Pulse 78  Ht 6' 3.5" (1.918 m)  Wt 227 lb (102.967 kg)  BMI 27.99 kg/m2 Well nourished, well developed, in no acute distress HEENT: normal Neck: no JVD Cardiac:  normal S1, S2; RRR; no murmur Lungs:  clear to auscultation bilaterally, no wheezing, rhonchi or rales Abd: soft, nontender, no hepatomegaly Ext: no edema Skin: warm and dry Neuro:  CNs 2-12 intact, no focal abnormalities noted  EKG:     NSR  with RBBB  ASSESSMENT AND PLAN:  1. SVT with no reoccurence - continue Verapamil   2. Chronic RBBB   3.  Exertional fatigue - I will get a Lexiscan myoview to rule out ischemia  Followup with me in 1 year   Signed, Fransico Him, MD [facility] 03/11/2014 9:01 AM

## 2014-03-13 ENCOUNTER — Ambulatory Visit (HOSPITAL_COMMUNITY): Payer: Medicare Other | Attending: Internal Medicine | Admitting: Radiology

## 2014-03-13 DIAGNOSIS — R002 Palpitations: Secondary | ICD-10-CM | POA: Insufficient documentation

## 2014-03-13 DIAGNOSIS — I4510 Unspecified right bundle-branch block: Secondary | ICD-10-CM | POA: Diagnosis not present

## 2014-03-13 DIAGNOSIS — T733XXA Exhaustion due to excessive exertion, initial encounter: Secondary | ICD-10-CM

## 2014-03-13 DIAGNOSIS — R5383 Other fatigue: Secondary | ICD-10-CM | POA: Diagnosis not present

## 2014-03-13 DIAGNOSIS — I471 Supraventricular tachycardia: Secondary | ICD-10-CM

## 2014-03-13 MED ORDER — TECHNETIUM TC 99M SESTAMIBI GENERIC - CARDIOLITE
11.0000 | Freq: Once | INTRAVENOUS | Status: AC | PRN
  Administered 2014-03-13: 11 via INTRAVENOUS

## 2014-03-13 MED ORDER — REGADENOSON 0.4 MG/5ML IV SOLN
0.4000 mg | Freq: Once | INTRAVENOUS | Status: AC
  Administered 2014-03-13: 0.4 mg via INTRAVENOUS

## 2014-03-13 MED ORDER — TECHNETIUM TC 99M SESTAMIBI GENERIC - CARDIOLITE
33.0000 | Freq: Once | INTRAVENOUS | Status: AC | PRN
  Administered 2014-03-13: 33 via INTRAVENOUS

## 2014-03-13 NOTE — Progress Notes (Signed)
[facility] [address] [place], [place] [address] [phone]    Cardiology Nuclear Med Study  Logan Aguirre is a 73 y.o. male     MRN : [id number]     DOB: April 13, 1941  Procedure Date: 03/13/2014  Nuclear Med Background Indication for Stress Test:  Evaluation for Ischemia History:  12/02 MPI: NL EF: 70%, PSVT Cardiac Risk Factors: RBBB  Symptoms:  Fatigue and Palpitations   Nuclear Pre-Procedure Caffeine/Decaff Intake:  None NPO After: 7:30pm   Lungs:  clear O2 Sat: 97% on room air. IV 0.9% NS with Angio Cath:  22g  IV Site: R Hand  IV Started by:  Crissie Figures, RN  Chest Size (in):  44 Cup Size: n/a  Height: 6' 3.5" (1.918 m)  Weight:  224 lb (101.606 kg)  BMI:  Body mass index is 27.62 kg/(m^2). Tech Comments:  N/A    Nuclear Med Study 1 or 2 day study: 1 day  Stress Test Type:  Lexiscan  Reading MD: N/A  Order Authorizing Provider:  Fransico Him, MD  Resting Radionuclide: Technetium 74m Sestamibi  Resting Radionuclide Dose: 11.0 mCi   Stress Radionuclide:  Technetium 67m Sestamibi  Stress Radionuclide Dose: 33.0 mCi           Stress Protocol Rest HR: 66 Stress HR: 108  Rest BP: 134/78 Stress BP: 145/70  Exercise Time (min): n/a METS: n/a   Predicted Max HR: 145 bpm % Max HR: 74.48 bpm Rate Pressure Product: 15660   Dose of Adenosine (mg):  n/a Dose of Lexiscan: 0.4 mg  Dose of Atropine (mg): n/a Dose of Dobutamine: n/a mcg/kg/min (at max HR)  Stress Test Technologist: Perrin Maltese, EMT-P  Nuclear Technologist:  Earl Many, CNMT     Rest Procedure:  Myocardial perfusion imaging was performed at rest 45 minutes following the intravenous administration of Technetium 67m Sestamibi. Rest ECG: NSR-RBBB  Stress Procedure:  The patient received IV Lexiscan 0.4 mg over 15-seconds.  Technetium 19m Sestamibi injected at 30-seconds. This patient had abdominal fluttering and felt funny with the Lexiscan injection. Quantitative  spect images were obtained after a 45 minute delay. Stress ECG: No significant change from baseline ECG  QPS Raw Data Images:  Normal; no motion artifact; normal heart/lung ratio. Stress Images:  Medium-sized, moderate inferior perfusion defect. Rest Images:  Medium-sized, moderate inferior perfusion defect.  Subtraction (SDS):  Partially reversible medium-sized, moderate inferior perfusion defect.  Transient Ischemic Dilatation (Normal <1.22):  1.03 Lung/Heart Ratio (Normal <0.45):  0.31  Quantitative Gated Spect Images QGS EDV:  108 ml QGS ESV:  51 ml  Impression Exercise Capacity:  Lexiscan with no exercise. BP Response:  Normal blood pressure response. Clinical Symptoms:  "funny feeling" ECG Impression:  No significant ST segment change suggestive of ischemia. Comparison with Prior Nuclear Study: No images to compare  Overall Impression:  Intermediate risk stress nuclear study with a partially reversible, medium-sized, moderate intensity inferior perfusion defect.  Normal inferior wall motion suggests that this could be diaphragmatic attenuation, but given some reversibility, I cannot rule out an area of infarction with peri-infarct ischemia. .  LV Ejection Fraction: 52%.  LV Wall Motion:  NL LV Function; NL Wall Motion   Loralie Champagne 03/13/2014

## 2014-03-18 ENCOUNTER — Other Ambulatory Visit: Payer: Self-pay | Admitting: Cardiology

## 2014-03-18 ENCOUNTER — Ambulatory Visit (INDEPENDENT_AMBULATORY_CARE_PROVIDER_SITE_OTHER): Payer: Medicare Other | Admitting: Cardiology

## 2014-03-18 VITALS — BP 120/74 | HR 75 | Ht 75.0 in | Wt 226.0 lb

## 2014-03-18 DIAGNOSIS — I471 Supraventricular tachycardia: Secondary | ICD-10-CM | POA: Diagnosis not present

## 2014-03-18 DIAGNOSIS — Z01812 Encounter for preprocedural laboratory examination: Secondary | ICD-10-CM

## 2014-03-18 DIAGNOSIS — R9439 Abnormal result of other cardiovascular function study: Secondary | ICD-10-CM | POA: Insufficient documentation

## 2014-03-18 DIAGNOSIS — T733XXA Exhaustion due to excessive exertion, initial encounter: Secondary | ICD-10-CM

## 2014-03-18 DIAGNOSIS — R931 Abnormal findings on diagnostic imaging of heart and coronary circulation: Secondary | ICD-10-CM | POA: Diagnosis not present

## 2014-03-18 HISTORY — DX: Exhaustion due to excessive exertion, initial encounter: T73.3XXA

## 2014-03-18 LAB — CBC WITH DIFFERENTIAL/PLATELET
BASOS ABS: 0 10*3/uL (ref 0.0–0.1)
Basophils Relative: 0.5 % (ref 0.0–3.0)
EOS ABS: 0.1 10*3/uL (ref 0.0–0.7)
Eosinophils Relative: 2 % (ref 0.0–5.0)
HCT: 45.7 % (ref 39.0–52.0)
HEMOGLOBIN: 15.4 g/dL (ref 13.0–17.0)
LYMPHS PCT: 23.2 % (ref 12.0–46.0)
Lymphs Abs: 1.7 10*3/uL (ref 0.7–4.0)
MCHC: 33.6 g/dL (ref 30.0–36.0)
MCV: 91.1 fl (ref 78.0–100.0)
MONOS PCT: 4.4 % (ref 3.0–12.0)
Monocytes Absolute: 0.3 10*3/uL (ref 0.1–1.0)
NEUTROS ABS: 5 10*3/uL (ref 1.4–7.7)
NEUTROS PCT: 69.9 % (ref 43.0–77.0)
Platelets: 200 10*3/uL (ref 150.0–400.0)
RBC: 5.01 Mil/uL (ref 4.22–5.81)
RDW: 13.6 % (ref 11.5–15.5)
WBC: 7.2 10*3/uL (ref 4.0–10.5)

## 2014-03-18 LAB — BASIC METABOLIC PANEL
BUN: 21 mg/dL (ref 6–23)
CALCIUM: 8.8 mg/dL (ref 8.4–10.5)
CO2: 23 mEq/L (ref 19–32)
CREATININE: 1.1 mg/dL (ref 0.4–1.5)
Chloride: 109 mEq/L (ref 96–112)
GFR: 70.04 mL/min (ref >60.00)
GLUCOSE: 119 mg/dL — AB (ref 70–99)
Potassium: 4.2 mEq/L (ref 3.5–5.1)
SODIUM: 144 meq/L (ref 135–145)

## 2014-03-18 LAB — PROTIME-INR
INR: 1 ratio (ref 0.8–1.0)
PROTHROMBIN TIME: 10.9 s (ref 9.6–13.1)

## 2014-03-18 MED ORDER — SODIUM CHLORIDE 0.9 % IV SOLN: INTRAVENOUS | Status: DC

## 2014-03-18 MED ORDER — PREDNISONE 20 MG PO TABS: ORAL_TABLET | ORAL | Status: DC

## 2014-03-18 NOTE — Patient Instructions (Addendum)
Your physician has requested that you have a cardiac catheterization. Cardiac catheterization is used to diagnose and/or treat various heart conditions. Doctors may recommend this procedure for a number of different reasons. The most common reason is to evaluate chest pain. Chest pain can be a symptom of coronary artery disease (CAD), and cardiac catheterization can show whether plaque is narrowing or blocking your heart's arteries. This procedure is also used to evaluate the valves, as well as measure the blood flow and oxygen levels in different parts of your heart. For further information please visit [email or web address]. Please follow instruction sheet, as given.  Your physician recommends that you have lab work TODAY (CBC, BMET, PT, PTT, INR)  You have a ONE TIME prescription for 60 mg Prednisone to take at 7 PM tomorrow night (03/19/14)

## 2014-03-18 NOTE — Progress Notes (Signed)
[address] [place], [place]  [address] Phone: [phone] Fax:  [phone]  Date:  03/18/2014   ID:  Sevag W Pilkington, DOB 05/17/1940, MRN [id number]  PCP:  No primary care provider on file.  Cardiologist:  Zaria Taha, MD    History of Present Illness: Logan Aguirre is a 73 y.o. male with a history of chronic RBBB and SVT who presents today for followup.When I last saw him he was complaining that he wears out easy with exertion and it is harder doing housework without getting worn out.  He underwent nuclear stress test which showed an intermediate risk stress nuclear study with a partially reversible, medium-sized, moderate intensity inferior perfusion defect.  LV Ejection Fraction: 52%. He now presents back today for discussion of results.   Wt Readings from Last 3 Encounters:  03/18/14 226 lb (102.513 kg)  03/13/14 224 lb (101.606 kg)  03/11/14 227 lb (102.967 kg)     Past Medical History  Diagnosis Date  . GERD (gastroesophageal reflux disease)   . Gout   . Colon polyp   . 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 Paroxysmal supraventricular tachycardia     Dr Jaleen Finch  . Right bundle branch block     normal coronary arteries   . Pure hypercholesterolemia   . Syncope and collapse 2009/2010    ? TIA    Current Outpatient Prescriptions  Medication Sig Dispense Refill  . aspirin EC 325 MG tablet Take 325 mg by mouth daily.    . atorvastatin (LIPITOR) 10 MG tablet Take 10 mg by mouth daily.    . colchicine 0.6 MG tablet Take 0.6 mg by mouth daily as needed (gout).     . finasteride (PROSCAR) 5 MG tablet Take 1 tablet by mouth daily.  3  . Multiple Vitamin (MULTIVITAMIN) capsule Take 1 capsule by mouth daily.    . omeprazole (PRILOSEC) 20 MG capsule Take 20 mg by mouth daily.    . tamsulosin (FLOMAX) 0.4 MG CAPS capsule Take 1 capsule by mouth daily.  3  . verapamil (CALAN-SR) 240 MG CR tablet Take 1 tablet (240 mg total) by mouth at  bedtime. 90 tablet 3   No current facility-administered medications for this visit.    Allergies:    Allergies  Allergen Reactions  . Amoxicillin     rash  . Shrimp [Shellfish Allergy]     Hives   . Zantac [Ranitidine Hcl]     hives    Social History:  The patient  reports that he has been smoking Cigars.  He does not have any smokeless tobacco history on file. He reports that he drinks about 1.8 oz of alcohol per week. He reports that he does not use illicit drugs.   Family History:  The patient's family history includes COPD in his father; CVA in his brother; Colon cancer in his father; Multiple myeloma in his mother; Suicidality in his brother.   ROS:  Please see the history of present illness.      All other systems reviewed and negative.   PHYSICAL EXAM: VS:  BP 120/74 mmHg  Pulse 75  Ht 6' 3" (1.905 m)  Wt 226 lb (102.513 kg)  BMI 28.25 kg/m2 Well nourished, well developed, in no acute distress HEENT: normal Neck: no JVD Cardiac:  normal S1, S2; RRR; no murmur Lungs:  clear to auscultation bilaterally, no wheezing, rhonchi or rales Abd: soft, nontender,   no hepatomegaly Ext: no edema Skin: warm and dry Neuro:  CNs 2-12 intact, no focal abnormalities noted  ASSESSMENT AND PLAN:  1. SVT with no reoccurence - continue Verapamil  2. Chronic RBBB  3. Exertional fatigue with abnormal Lexiscan myoveiw with a partially reversible, medium-sized, moderate intensity inferior perfusion defect.I have reviewed the findings of the cath with the patient and his wife.  He continues to have exertional fatigue.  He had a cath in 2004 that was normal.  His exertional fatigue has persisted so I have recommended that we proceed with left heart cath to evaluate coronary anatomy.  Cardiac catheterization was discussed with the patient fully including risks on myocardial infarction, death, stroke, bleeding, arrhythmia, dye allergy, renal insufficiency or bleeding.  All  patient questions and concerns were discussed and the patient understands and is willing to proceed.  We will set him up for cath tomorrow.   Signed, Fransico Him, MD [facility] 03/18/2014 10:20 AM

## 2014-03-20 ENCOUNTER — Encounter (HOSPITAL_COMMUNITY)
Admission: RE | Disposition: A | Discharge status: Home / Self Care | Payer: Self-pay | Source: Ambulatory Visit | Attending: Cardiovascular Disease

## 2014-03-20 ENCOUNTER — Ambulatory Visit (HOSPITAL_COMMUNITY)
Admission: RE | Admit: 2014-03-20 | Discharge: 2014-03-20 | Disposition: A | Discharge status: Home / Self Care | Payer: Medicare Other | Source: Ambulatory Visit | Attending: Cardiovascular Disease | Admitting: Cardiovascular Disease

## 2014-03-20 DIAGNOSIS — Z8673 Personal history of transient ischemic attack (TIA), and cerebral infarction without residual deficits: Secondary | ICD-10-CM | POA: Diagnosis not present

## 2014-03-20 DIAGNOSIS — R9439 Abnormal result of other cardiovascular function study: Secondary | ICD-10-CM | POA: Insufficient documentation

## 2014-03-20 DIAGNOSIS — I471 Supraventricular tachycardia: Secondary | ICD-10-CM | POA: Insufficient documentation

## 2014-03-20 DIAGNOSIS — Z7982 Long term (current) use of aspirin: Secondary | ICD-10-CM | POA: Diagnosis not present

## 2014-03-20 DIAGNOSIS — F17290 Nicotine dependence, other tobacco product, uncomplicated: Secondary | ICD-10-CM | POA: Insufficient documentation

## 2014-03-20 DIAGNOSIS — K219 Gastro-esophageal reflux disease without esophagitis: Secondary | ICD-10-CM | POA: Insufficient documentation

## 2014-03-20 DIAGNOSIS — E780 Pure hypercholesterolemia: Secondary | ICD-10-CM | POA: Insufficient documentation

## 2014-03-20 DIAGNOSIS — I4510 Unspecified right bundle-branch block: Secondary | ICD-10-CM | POA: Insufficient documentation

## 2014-03-20 DIAGNOSIS — R5383 Other fatigue: Secondary | ICD-10-CM | POA: Diagnosis not present

## 2014-03-20 HISTORY — PX: LEFT HEART CATHETERIZATION WITH CORONARY ANGIOGRAM: SHX5451

## 2014-03-20 SURGERY — LEFT HEART CATHETERIZATION WITH CORONARY ANGIOGRAM
Anesthesia: LOCAL

## 2014-03-20 MED ORDER — VERAPAMIL HCL 2.5 MG/ML IV SOLN
INTRAVENOUS | Status: AC
  Filled 2014-03-20: qty 2

## 2014-03-20 MED ORDER — NITROGLYCERIN 1 MG/10 ML FOR IR/CATH LAB
INTRA_ARTERIAL | Status: AC
  Filled 2014-03-20: qty 10

## 2014-03-20 MED ORDER — PREDNISONE 20 MG PO TABS
ORAL_TABLET | ORAL | Status: AC
  Administered 2014-03-20: 60 mg via ORAL
  Filled 2014-03-20: qty 3

## 2014-03-20 MED ORDER — LIDOCAINE HCL (PF) 1 % IJ SOLN
INTRAMUSCULAR | Status: AC
  Filled 2014-03-20: qty 30

## 2014-03-20 MED ORDER — DIPHENHYDRAMINE HCL 50 MG/ML IJ SOLN
INTRAMUSCULAR | Status: AC
  Administered 2014-03-20: 25 mg via INTRAVENOUS
  Filled 2014-03-20: qty 1

## 2014-03-20 MED ORDER — PREDNISONE 20 MG PO TABS
60.0000 mg | ORAL_TABLET | ORAL | Status: AC
  Administered 2014-03-20: 60 mg via ORAL

## 2014-03-20 MED ORDER — PREDNISONE 20 MG PO TABS: 60.0000 mg | ORAL_TABLET | ORAL | Status: DC

## 2014-03-20 MED ORDER — MIDAZOLAM HCL 2 MG/2ML IJ SOLN
INTRAMUSCULAR | Status: AC
  Filled 2014-03-20: qty 2

## 2014-03-20 MED ORDER — ASPIRIN 81 MG PO CHEW
81.0000 mg | CHEWABLE_TABLET | ORAL | Status: AC
  Administered 2014-03-20: 81 mg via ORAL

## 2014-03-20 MED ORDER — SODIUM CHLORIDE 0.9 % IV SOLN
250.0000 mL | INTRAVENOUS | Status: DC | PRN
  Administered 2014-03-20: 250 mL via INTRAVENOUS

## 2014-03-20 MED ORDER — HEPARIN SODIUM (PORCINE) 1000 UNIT/ML IJ SOLN
INTRAMUSCULAR | Status: AC
  Filled 2014-03-20: qty 1

## 2014-03-20 MED ORDER — SODIUM CHLORIDE 0.9 % IJ SOLN: 3.0000 mL | Freq: Two times a day (BID) | INTRAMUSCULAR | Status: DC

## 2014-03-20 MED ORDER — DIPHENHYDRAMINE HCL 50 MG/ML IJ SOLN
25.0000 mg | INTRAMUSCULAR | Status: AC
  Administered 2014-03-20: 25 mg via INTRAVENOUS

## 2014-03-20 MED ORDER — HEPARIN (PORCINE) IN NACL 2-0.9 UNIT/ML-% IJ SOLN
INTRAMUSCULAR | Status: AC
  Filled 2014-03-20: qty 1500

## 2014-03-20 MED ORDER — ASPIRIN 81 MG PO CHEW
CHEWABLE_TABLET | ORAL | Status: AC
  Administered 2014-03-20: 81 mg via ORAL
  Filled 2014-03-20: qty 1

## 2014-03-20 MED ORDER — SODIUM CHLORIDE 0.9 % IV SOLN: INTRAVENOUS | Status: AC

## 2014-03-20 MED ORDER — SODIUM CHLORIDE 0.9 % IJ SOLN: 3.0000 mL | INTRAMUSCULAR | Status: DC | PRN

## 2014-03-20 MED ORDER — FENTANYL CITRATE 0.05 MG/ML IJ SOLN
INTRAMUSCULAR | Status: AC
  Filled 2014-03-20: qty 2

## 2014-03-20 NOTE — Discharge Instructions (Signed)
Radial Site Care °Refer to this sheet in the next few weeks. These instructions provide you with information on caring for yourself after your procedure. Your caregiver may also give you more specific instructions. Your treatment has been planned according to current medical practices, but problems sometimes occur. Call your caregiver if you have any problems or questions after your procedure. °HOME CARE INSTRUCTIONS °· You may shower the day after the procedure. Remove the bandage (dressing) and gently wash the site with plain soap and water. Gently pat the site dry. °· Do not apply powder or lotion to the site. °· Do not submerge the affected site in water for 3 to 5 days. °· Inspect the site at least twice daily. °· Do not flex or bend the affected arm for 24 hours. °· No lifting over 5 pounds (2.3 kg) for 5 days after your procedure. °· Do not drive home if you are discharged the same day of the procedure. Have someone else drive you. °· You may drive 24 hours after the procedure unless otherwise instructed by your caregiver. °· Do not operate machinery or power tools for 24 hours. °· A responsible adult should be with you for the first 24 hours after you arrive home. °What to expect: °· Any bruising will usually fade within 1 to 2 weeks. °· Blood that collects in the tissue (hematoma) may be painful to the touch. It should usually decrease in size and tenderness within 1 to 2 weeks. °SEEK IMMEDIATE MEDICAL CARE IF: °· You have unusual pain at the radial site. °· You have redness, warmth, swelling, or pain at the radial site. °· You have drainage (other than a small amount of blood on the dressing). °· You have chills. °· You have a fever or persistent symptoms for more than 72 hours. °· You have a fever and your symptoms suddenly get worse. °· Your arm becomes pale, cool, tingly, or numb. °· You have heavy bleeding from the site. Hold pressure on the site. °Document Released: 05/22/2010 Document Revised:  07/12/2011 Document Reviewed: 05/22/2010 °ExitCare® Patient Information ©2015 [employer]. This information is not intended to replace advice given to you by your health care provider. Make sure you discuss any questions you have with your health care provider. ° °

## 2014-03-20 NOTE — H&P (View-Only) (Signed)
[address] [place], [place]  [address] Phone: [phone] Fax:  [phone]  Date:  03/18/2014   ID:  Israel, Werts 06-23-1940, MRN [id number]  PCP:  No primary care provider on file.  Cardiologist:  Fransico Him, MD    History of Present Illness: Logan Aguirre is a 73 y.o. male with a history of chronic RBBB and SVT who presents today for followup.When I last saw him he was complaining that he wears out easy with exertion and it is harder doing housework without getting worn out.  He underwent nuclear stress test which showed an intermediate risk stress nuclear study with a partially reversible, medium-sized, moderate intensity inferior perfusion defect.  LV Ejection Fraction: 52%. He now presents back today for discussion of results.   Wt Readings from Last 3 Encounters:  03/18/14 226 lb (102.513 kg)  03/13/14 224 lb (101.606 kg)  03/11/14 227 lb (102.967 kg)     Past Medical History  Diagnosis Date  . GERD (gastroesophageal reflux disease)   . Gout   . Colon polyp   . 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 Paroxysmal supraventricular tachycardia     Dr Radford Pax  . Right bundle branch block     normal coronary arteries   . Pure hypercholesterolemia   . Syncope and collapse 2009/2010    ? TIA    Current Outpatient Prescriptions  Medication Sig Dispense Refill  . aspirin EC 325 MG tablet Take 325 mg by mouth daily.    Marland Kitchen atorvastatin (LIPITOR) 10 MG tablet Take 10 mg by mouth daily.    . colchicine 0.6 MG tablet Take 0.6 mg by mouth daily as needed (gout).     . finasteride (PROSCAR) 5 MG tablet Take 1 tablet by mouth daily.  3  . Multiple Vitamin (MULTIVITAMIN) capsule Take 1 capsule by mouth daily.    Marland Kitchen omeprazole (PRILOSEC) 20 MG capsule Take 20 mg by mouth daily.    . tamsulosin (FLOMAX) 0.4 MG CAPS capsule Take 1 capsule by mouth daily.  3  . verapamil (CALAN-SR) 240 MG CR tablet Take 1 tablet (240 mg total) by mouth at  bedtime. 90 tablet 3   No current facility-administered medications for this visit.    Allergies:    Allergies  Allergen Reactions  . Amoxicillin     rash  . Shrimp [Shellfish Allergy]     Hives   . Zantac [Ranitidine Hcl]     hives    Social History:  The patient  reports that he has been smoking Cigars.  He does not have any smokeless tobacco history on file. He reports that he drinks about 1.8 oz of alcohol per week. He reports that he does not use illicit drugs.   Family History:  The patient's family history includes COPD in his father; CVA in his brother; Colon cancer in his father; Multiple myeloma in his mother; Suicidality in his brother.   ROS:  Please see the history of present illness.      All other systems reviewed and negative.   PHYSICAL EXAM: VS:  BP 120/74 mmHg  Pulse 75  Ht 6' 3" (1.905 m)  Wt 226 lb (102.513 kg)  BMI 28.25 kg/m2 Well nourished, well developed, in no acute distress HEENT: normal Neck: no JVD Cardiac:  normal S1, S2; RRR; no murmur Lungs:  clear to auscultation bilaterally, no wheezing, rhonchi or rales Abd: soft, nontender,  no hepatomegaly Ext: no edema Skin: warm and dry Neuro:  CNs 2-12 intact, no focal abnormalities noted  ASSESSMENT AND PLAN:  1. SVT with no reoccurence - continue Verapamil  2. Chronic RBBB  3. Exertional fatigue with abnormal Lexiscan myoveiw with a partially reversible, medium-sized, moderate intensity inferior perfusion defect.I have reviewed the findings of the cath with the patient and his wife.  He continues to have exertional fatigue.  He had a cath in 2004 that was normal.  His exertional fatigue has persisted so I have recommended that we proceed with left heart cath to evaluate coronary anatomy.  Cardiac catheterization was discussed with the patient fully including risks on myocardial infarction, death, stroke, bleeding, arrhythmia, dye allergy, renal insufficiency or bleeding.  All  patient questions and concerns were discussed and the patient understands and is willing to proceed.  We will set him up for cath tomorrow.   Signed, Fransico Him, MD [facility] 03/18/2014 10:20 AM

## 2014-03-20 NOTE — CV Procedure (Signed)
      Cardiac Catheterization Operative Report  Logan Aguirre [id number] 11/18/20152:32 PM No primary care provider on file.  Procedure Performed:  1. Left Heart Catheterization 2. Selective Coronary Angiography 3. Left ventricular angiogram  Operator: Lauree Chandler, MD  Arterial access site:  Right radial artery.   Indication: 73 yo male with history of SVT with recent fatigue. Stress myoview showed possible ischemia.                                        Procedure Details: The risks, benefits, complications, treatment options, and expected outcomes were discussed with the patient. The patient and/or family concurred with the proposed plan, giving informed consent. The patient was brought to the cath lab after IV hydration was begun and oral premedication was given. The patient was further sedated with Versed and Fentanyl. The right wrist was assessed with a modified Allens test which was positive. The right wrist was prepped and draped in a sterile fashion. 1% lidocaine was used for local anesthesia. Using the modified Seldinger access technique, a 5 French sheath was placed in the right radial artery. 3 mg Verapamil was given through the sheath. 5000 units IV heparin was given. Standard diagnostic catheters were used to perform selective coronary angiography. A pigtail catheter was used to perform a left ventricular angiogram. The sheath was removed from the right radial artery and a hemostasis band was applied at the arteriotomy site on the right wrist.   There were no immediate complications. The patient was taken to the recovery area in stable condition.   Hemodynamic Findings: Central aortic pressure: 127/66 Left ventricular pressure: 124/5/11  Angiographic Findings:  Left main: No obstructive disease.   Left Anterior Descending Artery: Large caliber vessel that courses to the apex. Moderate caliber diagonal branch. No obstructive disease.   Circumflex Artery:  Large caliber vessel with large caliber intermediate branch and moderate caliber obtuse marginal branch. No obstructive disease.   Right Coronary Artery: Large dominant vessel with no obstructive disease.   Left Ventricular Angiogram: LVEF=65%.   Impression: 1. No angiographic evidence of CAD 2. Normal LV systolic function  Recommendations: No further ischemic evaluation.        Complications:  None. The patient tolerated the procedure well.

## 2014-03-20 NOTE — Interval H&P Note (Signed)
History and Physical Interval Note:  03/20/2014 2:02 PM  Logan Aguirre  has presented today for cardiac cath with the diagnosis of abnormal stress test, dyspnea.  The various methods of treatment have been discussed with the patient and family. After consideration of risks, benefits and other options for treatment, the patient has consented to  Procedure(s): LEFT HEART CATHETERIZATION WITH CORONARY ANGIOGRAM (N/A) as a surgical intervention .  The patient's history has been reviewed, patient examined, no change in status, stable for surgery.  I have reviewed the patient's chart and labs.  Questions were answered to the patient's satisfaction.    Cath Lab Visit (complete for each Cath Lab visit)  Clinical Evaluation Leading to the Procedure:   ACS: No.  Non-ACS:    Anginal Classification: CCS II  Anti-ischemic medical therapy: Minimal Therapy (1 class of medications)  Non-Invasive Test Results: Intermediate-risk stress test findings: cardiac mortality 1-3%/year  Prior CABG: No previous CABG         MCALHANY,CHRISTOPHER

## 2014-03-29 ENCOUNTER — Telehealth: Payer: Self-pay

## 2014-03-29 NOTE — Telephone Encounter (Addendum)
Per Dr. Radford Pax, appointment made for 12/18 with Truitt Merle.

## 2014-04-05 DIAGNOSIS — R972 Elevated prostate specific antigen [PSA]: Secondary | ICD-10-CM | POA: Diagnosis not present

## 2014-04-11 ENCOUNTER — Encounter (HOSPITAL_COMMUNITY): Payer: Self-pay | Admitting: Cardiovascular Disease

## 2014-04-12 DIAGNOSIS — R3916 Straining to void: Secondary | ICD-10-CM | POA: Diagnosis not present

## 2014-04-12 DIAGNOSIS — R972 Elevated prostate specific antigen [PSA]: Secondary | ICD-10-CM | POA: Diagnosis not present

## 2014-04-12 DIAGNOSIS — N401 Enlarged prostate with lower urinary tract symptoms: Secondary | ICD-10-CM | POA: Diagnosis not present

## 2014-04-19 ENCOUNTER — Encounter: Payer: Self-pay | Admitting: Nurse Practitioner

## 2014-04-19 ENCOUNTER — Ambulatory Visit (INDEPENDENT_AMBULATORY_CARE_PROVIDER_SITE_OTHER): Payer: Medicare Other | Admitting: Nurse Practitioner

## 2014-04-19 VITALS — BP 104/52 | HR 72 | Resp 16 | Ht 76.0 in | Wt 232.0 lb

## 2014-04-19 DIAGNOSIS — Z9889 Other specified postprocedural states: Secondary | ICD-10-CM

## 2014-04-19 MED ORDER — VERAPAMIL HCL ER 240 MG PO TBCR: 240.0000 mg | EXTENDED_RELEASE_TABLET | Freq: Every day | ORAL | Status: DC

## 2014-04-19 MED ORDER — OMEPRAZOLE 20 MG PO CPDR: 20.0000 mg | DELAYED_RELEASE_CAPSULE | Freq: Every day | ORAL | Status: AC

## 2014-04-19 MED ORDER — ATORVASTATIN CALCIUM 10 MG PO TABS: 10.0000 mg | ORAL_TABLET | Freq: Every day | ORAL | Status: AC

## 2014-04-19 NOTE — Patient Instructions (Addendum)
Stay on your current medicines - I have sent in your refills today  See Dr. Radford Pax in November of 2016  Call the [place] office at [phone] if you have any questions, problems or concerns.

## 2014-04-19 NOTE — Progress Notes (Signed)
Logan Aguirre Date of Birth: [birth year] Medical Record #[id number]  History of Present Illness: Logan Aguirre is seen back today for a post cath visit. Seen for Dr. Radford Pax. He is a 73 y.o. male with a history of chronic RBBB and SVT. Other issues include GERD, gout, TIA and HLD. ? TIA in the past.   Seen last month with  fatigue He underwent nuclear stress test which showed an intermediate risk stress nuclear study with a partially reversible, medium-sized, moderate intensity inferior perfusion defect. LV Ejection Fraction: 52%. Referred on for cardiac cath.  Comes in today. Here alone. Doing well. No complaints. No chest pain. Not short of breath. Feels like he needs to get back in to his walking routine - he did this prior to retiring - about 1 1/2 miles daily. No problems with his cath site.    Current Outpatient Prescriptions  Medication Sig Dispense Refill  . aspirin EC 325 MG tablet Take 325 mg by mouth daily.    Marland Kitchen atorvastatin (LIPITOR) 10 MG tablet Take 10 mg by mouth daily.    . cholecalciferol (VITAMIN D) 1000 UNITS tablet Take 1,000 Units by mouth daily.    . colchicine 0.6 MG tablet Take 0.6 mg by mouth daily as needed (gout).     . Cyanocobalamin (VITAMIN B-12 PO) Take 5,000 mcg by mouth daily.    . finasteride (PROSCAR) 5 MG tablet Take 1 tablet by mouth daily.  3  . Multiple Vitamin (MULTIVITAMIN) capsule Take 1 capsule by mouth daily.    Marland Kitchen omeprazole (PRILOSEC) 20 MG capsule Take 20 mg by mouth daily.    . tamsulosin (FLOMAX) 0.4 MG CAPS capsule Take 1 capsule by mouth daily.  3  . verapamil (CALAN-SR) 240 MG CR tablet Take 1 tablet (240 mg total) by mouth at bedtime. 90 tablet 3   No current facility-administered medications for this visit.    Allergies  Allergen Reactions  . Amoxicillin     rash  . Shrimp [Shellfish Allergy]     Hives   . Zantac [Ranitidine Hcl]     hives    Past Medical History  Diagnosis Date  . GERD (gastroesophageal reflux  disease)   . Gout   . Colon polyp   . 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 Paroxysmal supraventricular tachycardia     Dr Radford Pax  . Right bundle branch block     normal coronary arteries   . Pure hypercholesterolemia   . Syncope and collapse 2009/2010    ? TIA    Past Surgical History  Procedure Laterality Date  . Cardiac catheterization      normal coronary arteries  . Left heart catheterization with coronary angiogram N/A 03/20/2014    Procedure: LEFT HEART CATHETERIZATION WITH CORONARY ANGIOGRAM;  Surgeon: Burnell Blanks, MD;  Location: [facility] C[facility] LAB;  Service: Cardiovascular;  Laterality: N/A;    History  Smoking status  . Current Some Day Smoker  . Types: Cigars  Smokeless tobacco  . Not on file    History  Alcohol Use  . 1.8 oz/week  . 3 Glasses of wine per week    Family History  Problem Relation Age of Onset  . Multiple myeloma Mother   . COPD Father   . Colon cancer Father   . Suicidality Brother   . CVA Brother     Review of Systems: The review of systems is per the HPI.  All other systems were reviewed and are negative.  Physical Exam: BP 104/52 mmHg  Pulse 72  Resp 16  Ht _0  (1.93 m)  Wt 232 lb (105.235 kg)  BMI 28.25 kg/m2 Patient is very pleasant and in no acute distress. Skin is warm and dry. Color is normal.  HEENT is unremarkable. Normocephalic/atraumatic. PERRL. Sclera are nonicteric. Neck is supple. No masses. No JVD. Lungs are clear. Cardiac exam shows a regular rate and rhythm. Abdomen is soft. Extremities are without edema. Gait and ROM are intact. No gross neurologic deficits noted. Right wrist looks good.   Wt Readings from Last 3 Encounters:  04/19/14 232 lb (105.235 kg)  03/20/14 225 lb (102.059 kg)  03/18/14 226 lb (102.513 kg)    LABORATORY DATA/PROCEDURES:  Lab Results  Component Value Date   WBC 7.2 03/18/2014   HGB 15.4 03/18/2014   HCT 45.7 03/18/2014   PLT 200.0 03/18/2014   GLUCOSE  119* 03/18/2014   CHOL  11/07/2007    173        ATP III CLASSIFICATION:  <200     mg/dL   Desirable  200-239  mg/dL   Borderline High  >=240    mg/dL   High   TRIG 144 11/07/2007   HDL 27* 11/07/2007   LDLCALC * 11/07/2007    117        Total Cholesterol/HDL:CHD Risk Coronary Heart Disease Risk Table                     Men   Women  1/2 Average Risk   3.4   3.3   ALT 21 11/07/2007   AST 19 11/07/2007   NA 144 03/18/2014   K 4.2 03/18/2014   CL 109 03/18/2014   CREATININE 1.1 03/18/2014   BUN 21 03/18/2014   CO2 23 03/18/2014   INR 1.0 03/18/2014    BNP (last 3 results) No results for input(s): PROBNP in the last 8760 hours.  Angiographic Findings:  Left main: No obstructive disease.   Left Anterior Descending Artery: Large caliber vessel that courses to the apex. Moderate caliber diagonal branch. No obstructive disease.   Circumflex Artery: Large caliber vessel with large caliber intermediate branch and moderate caliber obtuse marginal branch. No obstructive disease.   Right Coronary Artery: Large dominant vessel with no obstructive disease.   Left Ventricular Angiogram: LVEF=65%.   Impression: 1. No angiographic evidence of CAD 2. Normal LV systolic function  Recommendations: No further ischemic evaluation.    Complications: None. The patient tolerated the procedure well.       Assessment / Plan: 1. S/P cardiac cath - normal - he is doing well clinically.   2. False + Myoview  3. History of SVT -  Not an issue  4. RBBB - chronic  5. HLD - on statin  I have refilled his medicines. See back in November of 2016. Encouraged to get back to his walking program.  Patient is agreeable to this plan and will call if any problems develop in the interim.   Burtis Junes, RN, [facility] [address] [place], [place]  [address] [phone]

## 2014-06-25 DIAGNOSIS — H25813 Combined forms of age-related cataract, bilateral: Secondary | ICD-10-CM | POA: Diagnosis not present

## 2014-08-21 DIAGNOSIS — J309 Allergic rhinitis, unspecified: Secondary | ICD-10-CM | POA: Diagnosis not present

## 2014-08-21 DIAGNOSIS — R5383 Other fatigue: Secondary | ICD-10-CM | POA: Diagnosis not present

## 2014-08-21 DIAGNOSIS — M109 Gout, unspecified: Secondary | ICD-10-CM | POA: Diagnosis not present

## 2014-10-31 DIAGNOSIS — E780 Pure hypercholesterolemia: Secondary | ICD-10-CM | POA: Diagnosis not present

## 2014-10-31 DIAGNOSIS — I471 Supraventricular tachycardia: Secondary | ICD-10-CM | POA: Diagnosis not present

## 2014-10-31 DIAGNOSIS — M109 Gout, unspecified: Secondary | ICD-10-CM | POA: Diagnosis not present

## 2014-10-31 DIAGNOSIS — Z0000 Encounter for general adult medical examination without abnormal findings: Secondary | ICD-10-CM | POA: Diagnosis not present

## 2014-10-31 DIAGNOSIS — K219 Gastro-esophageal reflux disease without esophagitis: Secondary | ICD-10-CM | POA: Diagnosis not present

## 2014-12-11 ENCOUNTER — Encounter: Payer: Self-pay | Admitting: Cardiology

## 2015-03-06 DIAGNOSIS — Z23 Encounter for immunization: Secondary | ICD-10-CM | POA: Diagnosis not present

## 2015-03-12 ENCOUNTER — Ambulatory Visit (INDEPENDENT_AMBULATORY_CARE_PROVIDER_SITE_OTHER): Payer: Medicare Other | Admitting: Cardiology

## 2015-03-12 ENCOUNTER — Encounter: Payer: Self-pay | Admitting: Cardiology

## 2015-03-12 VITALS — BP 118/70 | HR 71 | Ht 76.0 in | Wt 219.6 lb

## 2015-03-12 DIAGNOSIS — I471 Supraventricular tachycardia: Secondary | ICD-10-CM

## 2015-03-12 DIAGNOSIS — I4510 Unspecified right bundle-branch block: Secondary | ICD-10-CM | POA: Diagnosis not present

## 2015-03-12 DIAGNOSIS — R55 Syncope and collapse: Secondary | ICD-10-CM | POA: Diagnosis not present

## 2015-03-12 NOTE — Progress Notes (Signed)
Cardiology Office Note   Date:  03/12/2015   ID:  Calab, Sachse 1940-11-21, MRN [id number]  PCP:  Marjorie Smolder, MD    Chief Complaint  Patient presents with  . Tachycardia      History of Present Illness: ARGIL MAHL is a 89.o. male with a history of chronic RBBB and SVT who presents today for followup.When I last saw him he was complaining that he wears out easy with exertion and it is harder doing housework without getting worn out. He underwent nuclear stress test which showed an intermediate risk stress nuclear study with a partially reversible, medium-sized, moderate intensity inferior perfusion defect.  LV Ejection Fraction: 52%. He underwent cath 03/2014 with normal coronary arteries and normal LVF.  He now presents today for evaluation.  He denies any chest pain, SOB, DOE, LE edema, dizziness (except for getting up to quick from sitting position), palpitations or syncope.     Past Medical History  Diagnosis Date  . GERD (gastroesophageal reflux disease)   . Gout   . Colon polyp   . 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 Paroxysmal supraventricular tachycardia ([place])     Dr Radford Pax  . Right bundle branch block     normal coronary arteries   . Pure hypercholesterolemia   . Syncope and collapse 2009/2010    ? TIA    Past Surgical History  Procedure Laterality Date  . Cardiac catheterization      normal coronary arteries  . Left heart catheterization with coronary angiogram N/A 03/20/2014    Procedure: LEFT HEART CATHETERIZATION WITH CORONARY ANGIOGRAM;  Surgeon: Burnell Blanks, MD;  Location: [facility] CATH LAB;  Service: Cardiovascular;  Laterality: N/A;     Current Outpatient Prescriptions  Medication Sig Dispense Refill  . allopurinol (ZYLOPRIM) 300 MG tablet take 1 tablet by mouth daily  5  . aspirin EC 325 MG tablet Take 325 mg by mouth daily.    Marland Kitchen atorvastatin (LIPITOR) 10 MG tablet Take 1 tablet  (10 mg total) by mouth daily. 90 tablet 3  . cholecalciferol (VITAMIN D) 1000 UNITS tablet Take 1,000 Units by mouth daily.    . colchicine 0.6 MG tablet Take 0.6 mg by mouth daily as needed (gout).     . Cyanocobalamin (VITAMIN B-12 PO) Take 5,000 mcg by mouth daily.    . finasteride (PROSCAR) 5 MG tablet Take 1 tablet by mouth daily.  3  . fluticasone (FLONASE) 50 MCG/ACT nasal spray Place 1 spray into both nostrils as needed for allergies.     . Multiple Vitamin (MULTIVITAMIN) capsule Take 1 capsule by mouth daily.    Marland Kitchen omeprazole (PRILOSEC) 20 MG capsule Take 1 capsule (20 mg total) by mouth daily. 90 capsule 3  . tamsulosin (FLOMAX) 0.4 MG CAPS capsule Take 1 capsule by mouth daily.  3  . verapamil (CALAN-SR) 240 MG CR tablet Take 1 tablet (240 mg total) by mouth at bedtime. 90 tablet 3   No current facility-administered medications for this visit.    Allergies:   Amoxicillin; Shrimp; and Zantac    Social History:  The patient  reports that he has been smoking Cigars.  He has never used smokeless tobacco. He reports that he drinks about 1.8 oz of alcohol per week. He reports that he does not use illicit drugs.   Family History:  The patient's family history includes COPD in his father; CVA in his brother; Colon cancer in his father; Multiple myeloma in his mother; Suicidality in his brother.    ROS:  Please see the history of present illness.   Otherwise, review of systems are positive for none.   All other systems are reviewed and negative.    PHYSICAL EXAM: VS:  BP 118/70 mmHg  Pulse 71  Ht 6' 4"  (1.93 m)  Wt 219 lb 9.6 oz (99.61 kg)  BMI 26.74 kg/m2 , BMI Body mass index is 26.74 kg/(m^2). GEN: Well nourished, well developed, in no acute distress HEENT: normal Neck: no JVD, carotid bruits, or masses Cardiac: RRR; no murmurs, rubs, or gallops,no edema  Respiratory:  clear to auscultation bilaterally, normal work of breathing GI: soft, nontender, nondistended, + BS MS: no  deformity or atrophy Skin: warm and dry, no rash Neuro:  Strength and sensation are intact Psych: euthymic mood, full affect   EKG:  EKG is ordered today. The ekg ordered today demonstrates NSR with RBBB   Recent Labs: 03/18/2014: BUN 21; Creatinine, Ser 1.1; Hemoglobin 15.4; Platelets 200.0; Potassium 4.2; Sodium 144    Lipid Panel    Component Value Date/Time   CHOL  11/07/2007 2355    173        ATP III CLASSIFICATION:  <200     mg/dL   Desirable  200-239  mg/dL   Borderline High  >=240    mg/dL   High   TRIG 144 11/07/2007 2355   HDL 27* 11/07/2007 2355   CHOLHDL 6.4 11/07/2007 2355   VLDL 29 11/07/2007 2355   LDLCALC * 11/07/2007 2355    117        Total Cholesterol/HDL:CHD Risk Coronary Heart Disease Risk Table                     Men   Women  1/2 Average Risk   3.4   3.3      Wt Readings from Last 3 Encounters:  03/12/15 219 lb 9.6 oz (99.61 kg)  04/19/14 232 lb (105.235 kg)  03/20/14 225 lb (102.059 kg)    Assessment / Plan: 1. S/P cardiac cath - normal - he is doing well clinically.   2. False + Myoview  3. History of SVT - No reoccurence on verapamil  4. RBBB - chronic  5. HLD - on statin and followed by PCP  I encouraged him to start walking for exercise.    Current medicines are reviewed at length with the patient today.  The patient does not have concerns regarding medicines.  The following changes have been made:  no change  Labs/ tests ordered today: See above Assessment and Plan No orders of the defined types were placed in this encounter.     Disposition:   FU with me in 1 year  Signed, Sueanne Margarita, MD  03/12/2015 3:52 PM    [facility] [place], [place], [place]  [address] Phone: [phone]; Fax: [phone]

## 2015-03-12 NOTE — Patient Instructions (Signed)

## 2015-04-07 DIAGNOSIS — R972 Elevated prostate specific antigen [PSA]: Secondary | ICD-10-CM | POA: Diagnosis not present

## 2015-04-11 DIAGNOSIS — N401 Enlarged prostate with lower urinary tract symptoms: Secondary | ICD-10-CM | POA: Diagnosis not present

## 2015-04-11 DIAGNOSIS — R972 Elevated prostate specific antigen [PSA]: Secondary | ICD-10-CM | POA: Diagnosis not present

## 2015-04-11 DIAGNOSIS — R3916 Straining to void: Secondary | ICD-10-CM | POA: Diagnosis not present

## 2015-08-07 DIAGNOSIS — J069 Acute upper respiratory infection, unspecified: Secondary | ICD-10-CM | POA: Diagnosis not present

## 2015-08-25 DIAGNOSIS — Z111 Encounter for screening for respiratory tuberculosis: Secondary | ICD-10-CM | POA: Diagnosis not present

## 2016-01-11 DIAGNOSIS — Z23 Encounter for immunization: Secondary | ICD-10-CM | POA: Diagnosis not present

## 2016-01-19 DIAGNOSIS — M109 Gout, unspecified: Secondary | ICD-10-CM | POA: Diagnosis not present

## 2016-01-19 DIAGNOSIS — Z5181 Encounter for therapeutic drug level monitoring: Secondary | ICD-10-CM | POA: Diagnosis not present

## 2016-01-19 DIAGNOSIS — Z0000 Encounter for general adult medical examination without abnormal findings: Secondary | ICD-10-CM | POA: Diagnosis not present

## 2016-01-19 DIAGNOSIS — K219 Gastro-esophageal reflux disease without esophagitis: Secondary | ICD-10-CM | POA: Diagnosis not present

## 2016-01-19 DIAGNOSIS — I471 Supraventricular tachycardia: Secondary | ICD-10-CM | POA: Diagnosis not present

## 2016-01-19 DIAGNOSIS — E7800 Pure hypercholesterolemia, unspecified: Secondary | ICD-10-CM | POA: Diagnosis not present

## 2016-04-09 DIAGNOSIS — R972 Elevated prostate specific antigen [PSA]: Secondary | ICD-10-CM | POA: Diagnosis not present

## 2016-04-16 DIAGNOSIS — N401 Enlarged prostate with lower urinary tract symptoms: Secondary | ICD-10-CM | POA: Diagnosis not present

## 2016-04-16 DIAGNOSIS — R972 Elevated prostate specific antigen [PSA]: Secondary | ICD-10-CM | POA: Diagnosis not present

## 2016-04-16 DIAGNOSIS — R3912 Poor urinary stream: Secondary | ICD-10-CM | POA: Diagnosis not present

## 2016-04-21 DIAGNOSIS — H02403 Unspecified ptosis of bilateral eyelids: Secondary | ICD-10-CM | POA: Diagnosis not present

## 2016-04-21 DIAGNOSIS — H25813 Combined forms of age-related cataract, bilateral: Secondary | ICD-10-CM | POA: Diagnosis not present

## 2016-05-22 DIAGNOSIS — M542 Cervicalgia: Secondary | ICD-10-CM | POA: Diagnosis not present

## 2016-05-22 DIAGNOSIS — M25512 Pain in left shoulder: Secondary | ICD-10-CM | POA: Diagnosis not present

## 2016-06-10 ENCOUNTER — Ambulatory Visit
Admission: RE | Admit: 2016-06-10 | Discharge: 2016-06-10 | Disposition: A | Discharge status: Home / Self Care | Payer: Medicare Other | Source: Ambulatory Visit | Attending: Family Medicine | Admitting: Family Medicine

## 2016-06-10 ENCOUNTER — Other Ambulatory Visit: Payer: Self-pay | Admitting: Family Medicine

## 2016-06-10 DIAGNOSIS — S8012XA Contusion of left lower leg, initial encounter: Secondary | ICD-10-CM | POA: Diagnosis not present

## 2016-06-10 DIAGNOSIS — M7989 Other specified soft tissue disorders: Secondary | ICD-10-CM | POA: Diagnosis not present

## 2016-06-10 DIAGNOSIS — W228XXA Striking against or struck by other objects, initial encounter: Secondary | ICD-10-CM

## 2016-06-28 ENCOUNTER — Emergency Department (HOSPITAL_COMMUNITY)
Admission: EM | Admit: 2016-06-28 | Discharge: 2016-06-28 | Disposition: A | Discharge status: Home / Self Care | Payer: Medicare Other | Attending: Emergency Medicine | Admitting: Emergency Medicine

## 2016-06-28 ENCOUNTER — Encounter (HOSPITAL_COMMUNITY): Payer: Self-pay | Admitting: Emergency Medicine

## 2016-06-28 ENCOUNTER — Emergency Department (HOSPITAL_COMMUNITY): Payer: Medicare Other

## 2016-06-28 DIAGNOSIS — R05 Cough: Secondary | ICD-10-CM | POA: Insufficient documentation

## 2016-06-28 DIAGNOSIS — Z8673 Personal history of transient ischemic attack (TIA), and cerebral infarction without residual deficits: Secondary | ICD-10-CM | POA: Diagnosis not present

## 2016-06-28 DIAGNOSIS — R059 Cough, unspecified: Secondary | ICD-10-CM

## 2016-06-28 DIAGNOSIS — I4892 Unspecified atrial flutter: Secondary | ICD-10-CM | POA: Insufficient documentation

## 2016-06-28 DIAGNOSIS — Z79899 Other long term (current) drug therapy: Secondary | ICD-10-CM | POA: Diagnosis not present

## 2016-06-28 DIAGNOSIS — F17290 Nicotine dependence, other tobacco product, uncomplicated: Secondary | ICD-10-CM | POA: Insufficient documentation

## 2016-06-28 DIAGNOSIS — R000 Tachycardia, unspecified: Secondary | ICD-10-CM | POA: Diagnosis present

## 2016-06-28 LAB — CBC WITH DIFFERENTIAL/PLATELET
Basophils Absolute: 0 10*3/uL (ref 0.0–0.1)
Basophils Relative: 0 %
EOS ABS: 0 10*3/uL (ref 0.0–0.7)
Eosinophils Relative: 0 %
HCT: 46.7 % (ref 39.0–52.0)
HEMOGLOBIN: 15.9 g/dL (ref 13.0–17.0)
Lymphocytes Relative: 13 %
Lymphs Abs: 0.9 10*3/uL (ref 0.7–4.0)
MCH: 32.2 pg (ref 26.0–34.0)
MCHC: 34 g/dL (ref 30.0–36.0)
MCV: 94.5 fL (ref 78.0–100.0)
MONOS PCT: 11 %
Monocytes Absolute: 0.8 10*3/uL (ref 0.1–1.0)
NEUTROS PCT: 76 %
Neutro Abs: 5.5 10*3/uL (ref 1.7–7.7)
Platelets: 167 10*3/uL (ref 150–400)
RBC: 4.94 MIL/uL (ref 4.22–5.81)
RDW: 13.8 % (ref 11.5–15.5)
WBC: 7.3 10*3/uL (ref 4.0–10.5)

## 2016-06-28 LAB — URINALYSIS, ROUTINE W REFLEX MICROSCOPIC
BILIRUBIN URINE: NEGATIVE
Glucose, UA: NEGATIVE mg/dL
Ketones, ur: NEGATIVE mg/dL
Leukocytes, UA: NEGATIVE
NITRITE: NEGATIVE
Protein, ur: NEGATIVE mg/dL
SPECIFIC GRAVITY, URINE: 1.014 (ref 1.005–1.030)
Squamous Epithelial / LPF: NONE SEEN
pH: 6 (ref 5.0–8.0)

## 2016-06-28 LAB — COMPREHENSIVE METABOLIC PANEL
ALK PHOS: 93 U/L (ref 38–126)
ALT: 26 U/L (ref 17–63)
AST: 32 U/L (ref 15–41)
Albumin: 3.7 g/dL (ref 3.5–5.0)
Anion gap: 10 (ref 5–15)
BUN: 15 mg/dL (ref 6–20)
CALCIUM: 8.8 mg/dL — AB (ref 8.9–10.3)
CO2: 25 mmol/L (ref 22–32)
CREATININE: 1.29 mg/dL — AB (ref 0.61–1.24)
Chloride: 101 mmol/L (ref 101–111)
GFR calc Af Amer: >60 mL/min (ref >60)
GFR, EST NON AFRICAN AMERICAN: 52 mL/min — AB (ref >60)
Glucose, Bld: 103 mg/dL — ABNORMAL HIGH (ref 65–99)
Potassium: 4.1 mmol/L (ref 3.5–5.1)
Sodium: 136 mmol/L (ref 135–145)
Total Bilirubin: 0.9 mg/dL (ref 0.3–1.2)
Total Protein: 6.6 g/dL (ref 6.5–8.1)

## 2016-06-28 LAB — I-STAT TROPONIN, ED: Troponin i, poc: 0 ng/mL (ref 0.00–0.08)

## 2016-06-28 LAB — I-STAT CG4 LACTIC ACID, ED: Lactic Acid, Venous: 1.27 mmol/L (ref 0.5–1.9)

## 2016-06-28 MED ORDER — BENZONATATE 100 MG PO CAPS: 100.0000 mg | ORAL_CAPSULE | Freq: Three times a day (TID) | ORAL | 0 refills | Status: DC

## 2016-06-28 MED ORDER — RIVAROXABAN 20 MG PO TABS
20.0000 mg | ORAL_TABLET | Freq: Once | ORAL | Status: AC
  Administered 2016-06-28: 20 mg via ORAL
  Filled 2016-06-28: qty 1

## 2016-06-28 MED ORDER — RIVAROXABAN (XARELTO) EDUCATION KIT FOR AFIB PATIENTS
PACK | Freq: Once | Status: AC
  Administered 2016-06-28: 16:00:00
  Filled 2016-06-28: qty 1

## 2016-06-28 MED ORDER — RIVAROXABAN 15 MG PO TABS: 15.0000 mg | ORAL_TABLET | Freq: Once | ORAL | Status: DC

## 2016-06-28 MED ORDER — RIVAROXABAN (XARELTO) VTE STARTER PACK (15 & 20 MG): 15.0000 mg | ORAL_TABLET | Freq: Two times a day (BID) | ORAL | 0 refills | Status: DC

## 2016-06-28 MED ORDER — RIVAROXABAN 20 MG PO TABS: 20.0000 mg | ORAL_TABLET | Freq: Every day | ORAL | 0 refills | Status: DC

## 2016-06-28 MED ORDER — SODIUM CHLORIDE 0.9 % IV BOLUS (SEPSIS)
1000.0000 mL | Freq: Once | INTRAVENOUS | Status: AC
  Administered 2016-06-28: 1000 mL via INTRAVENOUS

## 2016-06-28 NOTE — ED Provider Notes (Signed)
Emergency Department Provider Note   I have reviewed the triage vital signs and the nursing notes.   HISTORY  Chief Complaint Hypotension and Cough   HPI Logan Aguirre is a 76 y.o. male with PMH of GERD, paroxysmal SVT, RBBB, and prior TIA presents to the emergency department for evaluation of persistent cough and tachycardia. Patient's had several days of cough symptoms and been progressively worsening. His nasal discharge is improving cough continues. He has not been eating much over the past 2 days and saw his primary care physician in office today who noted elevated heart rate along with relative hypotension. No fever, chills, nausea, vomiting, or diarrhea. Symptoms have been constant and gradually worsening. No modifying factors. Patient tried Flonase and old codeine cough syrup with no relief.    Past Medical History:  Diagnosis Date  . Colon polyp   . GERD (gastroesophageal reflux disease)   . Gout   . Paroxysmal supraventricular tachycardia ([place])    Dr Radford Pax  . Pure hypercholesterolemia   . Right bundle branch block    normal coronary arteries   . Syncope and collapse 2009/2010   ? TIA  . 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 Abnormal nuclear stress test 03/18/2014  . Fatigue due to excessive exertion 03/18/2014  . RBBB 03/14/2013  . Paroxysmal supraventricular tachycardia ([place])   . Pure hypercholesterolemia   . Syncope and collapse   . GERD (gastroesophageal reflux disease)     Past Surgical History:  Procedure Laterality Date  . CARDIAC CATHETERIZATION     normal coronary arteries  . LEFT HEART CATHETERIZATION WITH CORONARY ANGIOGRAM N/A 03/20/2014   Procedure: LEFT HEART CATHETERIZATION WITH CORONARY ANGIOGRAM;  Surgeon: Burnell Blanks, MD;  Location: [facility] CATH LAB;  Service: Cardiovascular;  Laterality: N/A;    Current Outpatient Rx  . Order #: [id number] Class:  Historical Med  . Order #: [id number] Class: Normal  . Order #: [id number] Class: Historical Med  . Order #: [id number] Class: Historical Med  . Order #: [id number] Class: Normal  . Order #: [id number] Class: Historical Med  . Order #: [id number] Class: Normal  . Order #: [id number] Class: Print  . [START ON 06/29/2016] Order #: [id number] Class: Print    Allergies Amoxicillin; Shrimp [shellfish allergy]; and Zantac [ranitidine hcl]  Family History  Problem Relation Age of Onset  . Multiple myeloma Mother   . COPD Father   . Colon cancer Father   . Suicidality Brother   . CVA Brother     Social History Social History  Substance Use Topics  . Smoking status: Current Some Day Smoker    Types: Cigars  . Smokeless tobacco: Never Used  . Alcohol use 1.8 oz/week    3 Glasses of wine per week    Review of Systems  Constitutional: No fever/chills Eyes: No visual changes. ENT: No sore throat. Cardiovascular: Denies chest pain. Respiratory: Denies shortness of breath. Positive cough.  Gastrointestinal: No abdominal pain.  No nausea, no vomiting.  No diarrhea.  No constipation. Genitourinary: Negative for dysuria. Musculoskeletal: Negative for back pain. Skin: Negative for rash. Neurological: Negative for headaches, focal weakness or numbness.  10-point ROS otherwise negative.  ____________________________________________   PHYSICAL EXAM:  VITAL SIGNS: ED Triage Vitals  Enc Vitals Group     BP 06/28/16 1047 105/65     Pulse Rate 06/28/16 1047 92     Resp 06/28/16 1047 18  Temp 06/28/16 1047 98.4 F (36.9 C)     Temp Source 06/28/16 1047 Oral     SpO2 06/28/16 1047 97 %     Pain Score 06/28/16 1048 0   Constitutional: Alert and oriented. Well appearing and in no acute distress. Eyes: Conjunctivae are normal.  Head: Atraumatic. Nose: No congestion/rhinnorhea. Mouth/Throat: Mucous membranes are moist.  Oropharynx non-erythematous. Neck: No stridor.  Cardiovascular: Normal  rate in a-flutter on monitor. Good peripheral circulation. Grossly normal heart sounds.   Respiratory: Normal respiratory effort.  No retractions. Lungs CTAB. Gastrointestinal: Soft and nontender. No distention.  Musculoskeletal: No lower extremity tenderness nor edema. No gross deformities of extremities. Neurologic:  Normal speech and language. No gross focal neurologic deficits are appreciated.  Skin:  Skin is warm, dry and intact. No rash noted. Psychiatric: Mood and affect are normal. Speech and behavior are normal.  ____________________________________________   LABS (all labs ordered are listed, but only abnormal results are displayed)  Labs Reviewed  COMPREHENSIVE METABOLIC PANEL - Abnormal; Notable for the following:       Result Value   Glucose, Bld 103 (*)    Creatinine, Ser 1.29 (*)    Calcium 8.8 (*)    GFR calc non Af Amer 52 (*)    All other components within normal limits  URINALYSIS, ROUTINE W REFLEX MICROSCOPIC - Abnormal; Notable for the following:    Hgb urine dipstick SMALL (*)    Bacteria, UA RARE (*)    All other components within normal limits  CBC WITH DIFFERENTIAL/PLATELET  I-STAT CG4 LACTIC ACID, ED  I-STAT TROPOININ, ED  I-STAT CG4 LACTIC ACID, ED   ____________________________________________  EKG   EKG Interpretation  Date/Time:  Monday June 28 2016 15:58:20 EST Ventricular Rate:  86 PR Interval:    QRS Duration: 144 QT Interval:  400 QTC Calculation: 479 R Axis:   91 Text Interpretation:  Atrial fibrillation Right bundle branch block No STEMI.  Confirmed by Diana Armijo MD, Montina Dorrance [phone]) on 06/28/2016 4:12:15 PM       ____________________________________________  RADIOLOGY  Dg Chest 2 View  Result Date: 06/28/2016 CLINICAL DATA:  Cough for several days EXAM: CHEST  2 VIEW COMPARISON:  11/07/2007 FINDINGS: The heart size and mediastinal contours are within normal limits. Both lungs are clear. The visualized skeletal structures are  unremarkable. IMPRESSION: No active cardiopulmonary disease. Electronically Signed   By: Inez Catalina M.D.   On: 06/28/2016 11:25    ____________________________________________   PROCEDURES  Procedure(s) performed:   Procedures  None ____________________________________________   INITIAL IMPRESSION / ASSESSMENT AND PLAN / ED COURSE  Pertinent labs & imaging results that were available during my care of the patient were reviewed by me and considered in my medical decision making (see chart for details).  Patient presents to the emergency department for evaluation of cough, tachycardia, and relative hypotension. Patient is very well-appearing on my exam. He has no hypoxemia. Blood pressure is normal. He does have relative tachycardia appears to be sinus on monitor. We'll obtain EKG along with lab work. Chest x-ray with no pneumonia.   Spoke with Cardiology regarding the a-flutter on initial EKG. Rate is controlled and a-flutter was thought to be found incidentally. No hypotension. Normal troponin, lactate, and CXR. Patient has a Film/video editor for paroxysmal SVT and is already on Verapamil. Cardiology recommends starting anticoagulation and discharge. They will help to arrange close follow up. Will discontinue ASA. Patient with continue Verapamil.   Discussed this with the patient and wife  at bedside. Cough likely from URI. Started Xarelto and provided starter pack. Asked Pharmacy to discuss with patient and gave first dose in the ED. Again, discussed discontinuing ASA. Rate in the 80s at discharge.   At this time, I do not feel there is any life-threatening condition present. I have reviewed and discussed all results (EKG, imaging, lab, urine as appropriate), exam findings with patient. I have reviewed nursing notes and appropriate previous records.  I feel the patient is safe to be discharged home without further emergent workup. Discussed usual and customary return precautions. Patient and  family (if present) verbalize understanding and are comfortable with this plan.  Patient will follow-up with their primary care provider. If they do not have a primary care provider, information for follow-up has been provided to them. All questions have been answered.  ____________________________________________  FINAL CLINICAL IMPRESSION(S) / ED DIAGNOSES  Final diagnoses:  Atrial flutter, unspecified type ([place])  Cough     MEDICATIONS GIVEN DURING THIS VISIT:  Medications  sodium chloride 0.9 % bolus 1,000 mL (0 mLs Intravenous Stopped 06/28/16 1518)  rivaroxaban (XARELTO) Education Kit for Afib patients ( Does not apply Given 06/28/16 1604)  rivaroxaban (XARELTO) tablet 20 mg (20 mg Oral Given 06/28/16 1603)     NEW OUTPATIENT MEDICATIONS STARTED DURING THIS VISIT:  Discharge Medication List as of 06/28/2016  3:47 PM    START taking these medications   Details  benzonatate (TESSALON) 100 MG capsule Take 1 capsule (100 mg total) by mouth every 8 (eight) hours., Starting Mon 06/28/2016, Print    rivaroxaban (XARELTO) 20 MG TABS tablet Take 1 tablet (20 mg total) by mouth daily with supper., Starting Tue 06/29/2016, Print         Note:  This document was prepared using Dragon voice recognition software and may include unintentional dictation errors.  Nanda Quinton, MD Emergency Medicine   Margette Fast, MD 06/28/16 [phone]

## 2016-06-28 NOTE — ED Notes (Signed)
This rn went in to discharge patient and the 12 lead ekg looked different, new ekg done and given to edp, the patient no longer is in a flutter and is in a fib, patient denies any new symptoms and heart rate is 86, per edp pt will still go home and follow up with cardiology tomorrow, patient verbalized understanding and importance.

## 2016-06-28 NOTE — ED Notes (Signed)
Pharmacy at bedside

## 2016-06-28 NOTE — ED Triage Notes (Signed)
Pt sent from [facility], seen there for flu like symptoms that began Friday night, pt reports he was negative for flu, sent to ED for further evaluation of low blood pressure and tachycardia. bp 105/65, pulse 92. Pt a/ox4 resp e/u, nad. Denies chest pain or sob.

## 2016-06-28 NOTE — Discharge Instructions (Addendum)
You were seen today with cough and found to be in an abnormal heart rhythm, atrial flutter. We are starting a blood thinner called Xarelto. You should stop taking your Aspirin immediately. Call your Cardiologist today and schedule an appointment for the next week.   Return to the ED immediately with any blood in the stool, chest pain, difficulty breathing, or lightheadedness.    Information on my medicine - XARELTO (Rivaroxaban)  This medication education was reviewed with me or my healthcare representative as part of my discharge preparation.  The pharmacist that spoke with me during my hospital stay was:  Einar Grad, [facility]  WHY WAS [place]? Xarelto was prescribed for you to reduce the risk of a blood clot forming that can cause a stroke if you have a medical condition called atrial fibrillation (a type of irregular heartbeat).  WHAT DO YOU NEED TO KNOW ABOUT XARELTO ? Take your Xarelto ONCE DAILY at the same time every day with your evening meal. If you have difficulty swallowing the tablet whole, you may crush it and mix in applesauce just prior to taking your dose.  Take Xarelto exactly as prescribed by your doctor and DO NOT stop taking Xarelto without talking to the doctor who prescribed the medication.  Stopping without other stroke prevention medication to take the place of Xarelto may increase your risk of developing a clot that causes a stroke.  Refill your prescription before you run out.  After discharge, you should have regular check-up appointments with your healthcare provider that is prescribing your Xarelto.  In the future your dose may need to be changed if your kidney function or weight changes by a significant amount.  WHAT DO YOU DO IF YOU MISS A DOSE? If you are taking Xarelto ONCE DAILY and you miss a dose, take it as soon as you remember on the same day then continue your regularly scheduled once daily regimen the next day. Do not take  two doses of Xarelto at the same time or on the same day.   IMPORTANT SAFETY INFORMATION A possible side effect of Xarelto is bleeding. You should call your healthcare provider right away if you experience any of the following: Bleeding from an injury or your nose that does not stop. Unusual colored urine (red or dark brown) or unusual colored stools (red or black). Unusual bruising for unknown reasons. A serious fall or if you hit your head (even if there is no bleeding).  Some medicines may interact with Xarelto and might increase your risk of bleeding while on Xarelto. To help avoid this, consult your healthcare provider or pharmacist prior to using any new prescription or non-prescription medications, including herbals, vitamins, non-steroidal anti-inflammatory drugs (NSAIDs) and supplements.  This website has more information on Xarelto: [email or web address]

## 2016-06-29 ENCOUNTER — Ambulatory Visit (INDEPENDENT_AMBULATORY_CARE_PROVIDER_SITE_OTHER): Payer: Medicare Other | Admitting: Cardiology

## 2016-06-29 ENCOUNTER — Encounter: Payer: Self-pay | Admitting: Cardiology

## 2016-06-29 VITALS — BP 130/72 | HR 78 | Ht 76.0 in | Wt 216.5 lb

## 2016-06-29 DIAGNOSIS — I4510 Unspecified right bundle-branch block: Secondary | ICD-10-CM | POA: Diagnosis not present

## 2016-06-29 DIAGNOSIS — R5383 Other fatigue: Secondary | ICD-10-CM

## 2016-06-29 DIAGNOSIS — I471 Supraventricular tachycardia: Secondary | ICD-10-CM

## 2016-06-29 NOTE — Progress Notes (Signed)
06/29/2016 Logan Aguirre   [birth year]  [id number]  Primary Physician Marjorie Smolder, MD Primary Cardiologist: Dr. Radford Pax    Reason for Visit/CC: Post ED F/u for Newly Diagnosed Atrial Flutter   HPI:  The patient is a 76 y/o male followed by Dr. Radford Pax. He has a h/o chronic RBBB and SVT that has been treated with verapamil. He had an abnormal stress test in 2015, leading to a [facility]. This was a false + test as his LHC showed normal coronaries. He had an echo in 2009 that showed normal LVEF. He also has a prior h/o TIA.   He went to the [facility] ED yesterday given upper respiratory symptoms. Nasal drainage, congestion, nonproductive cough and wheezing. He went to his PCP who then sent him to the ED. CXR was unremarkable. CBC w/o elevated WBC. EKG was obtained which showed newly diagnosed atrial flutter with a CVR. He was asymptomatic. BMP showed stable K. No TSH or Mg was checked. UA was negative for UTI. He ruled out for flu. He was diagnosed with an URI but no indication for antibiotic. Given he was symptomatic with his atrial flutter with CVR, he did not require admission or immediate cardiology consultation. He was instructed to continue his verapamil (dose not adjusted). He was also given a Rx for Xarelto for a/c given a CHA2DS2 VASc score of at least 4 (prior TIA and age >70). He was advised to f/u in our clinic for further w/u.  EKG today shows NSR. HR is 80 bpm. BP is stable. No chest pain, dyspnea, palpitations, orthopnea, PND or LEE. No dizziness, syncope/ near syncope. Still with UR symptoms.  Current Meds  Medication Sig  . allopurinol (ZYLOPRIM) 300 MG tablet take 1 tablet by mouth daily  . atorvastatin (LIPITOR) 10 MG tablet Take 1 tablet (10 mg total) by mouth daily.  . benzonatate (TESSALON) 100 MG capsule Take 1 capsule (100 mg total) by mouth every 8 (eight) hours.  . finasteride (PROSCAR) 5 MG tablet Take 1 tablet by mouth daily.  . fluticasone (FLONASE) 50 MCG/ACT nasal spray  Place 1 spray into both nostrils as needed for allergies.   Marland Kitchen omeprazole (PRILOSEC) 20 MG capsule Take 1 capsule (20 mg total) by mouth daily.  . rivaroxaban (XARELTO) 20 MG TABS tablet Take 1 tablet (20 mg total) by mouth daily with supper.  . tamsulosin (FLOMAX) 0.4 MG CAPS capsule Take 1 capsule by mouth daily.  . verapamil (CALAN-SR) 240 MG CR tablet Take 1 tablet (240 mg total) by mouth at bedtime.   Allergies  Allergen Reactions  . Amoxicillin     rash  . Shrimp [Shellfish Allergy]     Hives   . Zantac [Ranitidine Hcl]     hives   Past Medical History:  Diagnosis Date  . Colon polyp   . GERD (gastroesophageal reflux disease)   . Gout   . Paroxysmal supraventricular tachycardia ([place])    Dr Radford Pax  . Pure hypercholesterolemia   . Right bundle branch block    normal coronary arteries   . Syncope and collapse 2009/2010   ? TIA  . 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 Multiple myeloma Mother   . COPD Father   . Colon cancer Father   . Suicidality Brother   . CVA Brother    Past Surgical History:  Procedure Laterality Date  . CARDIAC CATHETERIZATION  normal coronary arteries  . LEFT HEART CATHETERIZATION WITH CORONARY ANGIOGRAM N/A 03/20/2014   Procedure: LEFT HEART CATHETERIZATION WITH CORONARY ANGIOGRAM;  Surgeon: Burnell Blanks, MD;  Location: [facility] CATH LAB;  Service: Cardiovascular;  Laterality: N/A;   Social History   Social History  . Marital status: Married    Spouse name: N/A  . Number of children: N/A  . Years of education: N/A   Occupational History  . Not on file.   Social History Main Topics  . Smoking status: Current Some Day Smoker    Types: Cigars  . Smokeless tobacco: Never Used  . Alcohol use 1.8 oz/week    3 Glasses of wine per week  . Drug use: No  . Sexual activity: Not on file   Other Topics Concern  . Not on file   Social History Narrative   Current  smoker cigar (occasional)   Alcohol -yes wine 3x a week   No caffeine    No recreational drug use   Diet yes- diet no salt ,no caffeine   Occupation principal,teacher   Martial status   Children 2     Review of Systems: General: negative for chills, fever, night sweats or weight changes.  Cardiovascular: negative for chest pain, dyspnea on exertion, edema, orthopnea, palpitations, paroxysmal nocturnal dyspnea or shortness of breath Dermatological: negative for rash Respiratory: negative for cough or wheezing Urologic: negative for hematuria Abdominal: negative for nausea, vomiting, diarrhea, bright red blood per rectum, melena, or hematemesis Neurologic: negative for visual changes, syncope, or dizziness All other systems reviewed and are otherwise negative except as noted above.   Physical Exam:  Blood pressure 130/72, pulse 78, height 6' 4"  (1.93 m), weight 216 lb 8 oz (98.2 kg), SpO2 97 %.  General appearance: alert, cooperative and no distress Neck: no carotid bruit and no JVD Lungs: clear to auscultation bilaterally Heart: regular rate and rhythm, S1, S2 normal, no murmur, click, rub or gallop Extremities: extremities normal, atraumatic, no cyanosis or edema Pulses: 2+ and symmetric Skin: Skin color, texture, turgor normal. No rashes or lesions Neurologic: Grossly normal  EKG NSR. 80 bpm   ASSESSMENT AND PLAN:   1. Newly Diagnosed Atrial Flutter: I reviewed ED EKG with Dr. Curt Bears, DOD. He confirmed atrial flutter. EKG today shows NSR. He was completely asymptomatic and V-rates have been well controlled with verapamil. His atrial arrthymia may have been triggered by his URI. Dr. Curt Bears agrees that we should continue rate control therapy with verapamil and continue Xarelto for a/c given his CHA2DS2 VASc score of 4 (h/o TIA, age >55). We will check a 2D echo as well as a TSH, free T4 and Mg level. F/u with Dr. Radford Pax in 6-8 weeks or sooner if he develops any symptoms. If  recurrent atrial fibrillation/flutter, can later consider AAD therapy.    Lyda Jester PA-C 06/29/2016 5:05 PM

## 2016-06-29 NOTE — Patient Instructions (Addendum)
Medication Instructions:  Your physician recommends that you continue on your current medications as directed. Please refer to the Current Medication list given to you today.   Labwork: TODAY:  FREE T3, FREE T4, TSH, & MAG  Testing/Procedures: Your physician has requested that you have an echocardiogram. Echocardiography is a painless test that uses sound waves to create images of your heart. It provides your doctor with information about the size and shape of your heart and how well your heart's chambers and valves are working. This procedure takes approximately one hour. There are no restrictions for this procedure.   Follow-Up: Your physician recommends that you schedule a follow-up appointment in: 2-3 MONTHS WITH DR. Radford Pax   Any Other Special Instructions Will Be Listed Below (If Applicable).  Echocardiogram An echocardiogram, or echocardiography, uses sound waves (ultrasound) to produce an image of your heart. The echocardiogram is simple, painless, obtained within a short period of time, and offers valuable information to your health care provider. The images from an echocardiogram can provide information such as:  Evidence of coronary artery disease (CAD).  Heart size.  Heart muscle function.  Heart valve function.  Aneurysm detection.  Evidence of a past heart attack.  Fluid buildup around the heart.  Heart muscle thickening.  Assess heart valve function. Tell a health care provider about:  Any allergies you have.  All medicines you are taking, including vitamins, herbs, eye drops, creams, and over-the-counter medicines.  Any problems you or family members have had with anesthetic medicines.  Any blood disorders you have.  Any surgeries you have had.  Any medical conditions you have.  Whether you are pregnant or may be pregnant. What happens before the procedure? No special preparation is needed. Eat and drink normally. What happens during the  procedure?  In order to produce an image of your heart, gel will be applied to your chest and a wand-like tool (transducer) will be moved over your chest. The gel will help transmit the sound waves from the transducer. The sound waves will harmlessly bounce off your heart to allow the heart images to be captured in real-time motion. These images will then be recorded.  You may need an IV to receive a medicine that improves the quality of the pictures. What happens after the procedure? You may return to your normal schedule including diet, activities, and medicines, unless your health care provider tells you otherwise. This information is not intended to replace advice given to you by your health care provider. Make sure you discuss any questions you have with your health care provider. Document Released: 04/16/2000 Document Revised: 12/06/2015 Document Reviewed: 12/25/2012 Elsevier Interactive Patient Education  2017 [employer].  If you need a refill on your cardiac medications before your next appointment, please call your pharmacy.

## 2016-06-30 LAB — T4, FREE: Free T4: 1.48 ng/dL (ref 0.82–1.77)

## 2016-06-30 LAB — T3, FREE: T3 FREE: 3.1 pg/mL (ref 2.0–4.4)

## 2016-06-30 LAB — MAGNESIUM: Magnesium: 2.3 mg/dL (ref 1.6–2.3)

## 2016-06-30 LAB — TSH: TSH: 1.19 u[IU]/mL (ref 0.450–4.500)

## 2016-07-02 ENCOUNTER — Telehealth: Payer: Self-pay | Admitting: Cardiology

## 2016-07-02 NOTE — Telephone Encounter (Signed)
Error

## 2016-07-05 ENCOUNTER — Telehealth: Payer: Self-pay | Admitting: Cardiology

## 2016-07-05 DIAGNOSIS — R040 Epistaxis: Secondary | ICD-10-CM | POA: Diagnosis not present

## 2016-07-05 DIAGNOSIS — R05 Cough: Secondary | ICD-10-CM | POA: Diagnosis not present

## 2016-07-05 DIAGNOSIS — Z7901 Long term (current) use of anticoagulants: Secondary | ICD-10-CM | POA: Diagnosis not present

## 2016-07-05 DIAGNOSIS — R030 Elevated blood-pressure reading, without diagnosis of hypertension: Secondary | ICD-10-CM | POA: Diagnosis not present

## 2016-07-05 DIAGNOSIS — I4891 Unspecified atrial fibrillation: Secondary | ICD-10-CM | POA: Diagnosis not present

## 2016-07-05 NOTE — Telephone Encounter (Signed)
Needs to discuss stopping Xarelto with ER MD as I am not examining him.  If he has something that can be cauterized and the ER MD feels safe to continue on Xarelto then would follow their instructions.  He needs to call us to let us know what happened with ER visit

## 2016-07-05 NOTE — Telephone Encounter (Signed)
New message    Pt c/o medication issue:  1. Name of Medication: rivaroxaban (XARELTO) 20 MG TABS tablet  2. How are you currently taking this medication (dosage and times per day)? Patient has a nose bleed  3. Are you having a reaction (difficulty breathing--STAT)? Nose bleed  4. What is your medication issue? Has a head cold / started last Monday /

## 2016-07-05 NOTE — Telephone Encounter (Signed)
I spoke with the pt and he currently has a nose bleed.  The pt states this started 45 minutes ago and is a constant drip.  He denies gushing blood from his nose but he cannot get the bleeding to stop. The pt is taking Xarelto at this time.  Currently the pt is in [place] and I advised him that he should proceed to closest ER for further evaluation/treatment of nose bleed.  The pt agreed with plan. The pt asked if he needs to stop Xarelto due to bleeding and I advised him that I will have to have his cardiologist make this determination. At this time the pt will proceed to ER.

## 2016-07-06 DIAGNOSIS — R040 Epistaxis: Secondary | ICD-10-CM | POA: Diagnosis not present

## 2016-07-08 NOTE — Telephone Encounter (Signed)
Patient states he went to and ED and the bleeding stopped. Cauterization was not necessary. He saw ENT, Dr. Lucia Gaskins, the day after and was told he has sensitive capillaries at the surface in his nares that easily break when blowing/scratching the nose. He states he has gotten nose bleeds before but they quickly stopped. He states the Xarelto did not cause the nosebleed but exacerbated the bleeding and he thought he "was going to bleed out." The patient stated he did not know why he was on Xarelto. Explained to him the medication will decrease risk of stroke given comorbidities so he will need to continue for prevention. The patient would like recommendations for medication alternatives as he "does not want to live in fear of bleeding out every day." He requests Dr. Radford Pax change the medication to something he won't be afraid to take. He understands there are minimal other options but will be called with recommendations.

## 2016-07-08 NOTE — Telephone Encounter (Signed)
To [facility] for dosing instructions for Eliquis to offer patient.

## 2016-07-08 NOTE — Telephone Encounter (Signed)
The only alternative is coumadin which has more risk of bleeding.  Could change to Eliquis but all are blood thinners and the purpose is to prevent a stroke in setting of PAF

## 2016-07-09 NOTE — Telephone Encounter (Signed)
Pt would qualify for Eliquis 5mg  BID for aflutter with age < 89, SCr < 1.5, and wt > 60kg.

## 2016-07-14 ENCOUNTER — Other Ambulatory Visit: Payer: Self-pay

## 2016-07-14 ENCOUNTER — Ambulatory Visit (HOSPITAL_COMMUNITY): Payer: Medicare Other | Attending: Cardiovascular Disease

## 2016-07-14 DIAGNOSIS — I071 Rheumatic tricuspid insufficiency: Secondary | ICD-10-CM | POA: Diagnosis not present

## 2016-07-14 DIAGNOSIS — G459 Transient cerebral ischemic attack, unspecified: Secondary | ICD-10-CM | POA: Diagnosis not present

## 2016-07-14 DIAGNOSIS — I471 Supraventricular tachycardia: Secondary | ICD-10-CM

## 2016-07-14 DIAGNOSIS — I4510 Unspecified right bundle-branch block: Secondary | ICD-10-CM | POA: Diagnosis not present

## 2016-07-14 DIAGNOSIS — I4892 Unspecified atrial flutter: Secondary | ICD-10-CM | POA: Insufficient documentation

## 2016-07-14 NOTE — Telephone Encounter (Signed)
Patient states he has a flight scheduled out to [place] [facility] on Monday and wants to be cleared to fly ASAP (he completed his ECHO today). He understands he will be called with ECHO results and flight clearance tomorrow.  He also states he will continue taking Xarelto and will not change at this time. He understands he has other options if he would like to switch.

## 2016-07-14 NOTE — Telephone Encounter (Signed)
Follow up      Pt had an echo this am.  He has a flight scheduled on Monday to [place].  Will it be ok?

## 2016-07-15 NOTE — Telephone Encounter (Signed)
Follow up  Pts wife voiced she is waiting for a call back to clear for the flight on Monday.  Please f/u with pt

## 2016-07-15 NOTE — Telephone Encounter (Signed)
Message  Received: Today  Message Contents  Sueanne Margarita, MD  Theodoro Parma, RN        He is fine to fly   Previous Messages    ----- Message -----  From: Theodoro Parma, RN  Sent: 07/15/2016  8:12 AM  To: Sueanne Margarita, MD   Patient would like to know if it is OK to fly Monday        Informed patient is is fine to fly per Dr. Radford Pax. He was grateful for call.

## 2016-07-23 LAB — ECHOCARDIOGRAM COMPLETE
E decel time: 218 ms
E/e' ratio: 9.16
FS: 31 % (ref 28–44)
IVS/LV PW RATIO, ED: 1.13
LA ID, A-P, ES: 37 mm
LA diam end sys: 37 mm
LA diam index: 1.62 cm/m2
LA vol A4C: 73.6 mL
LA vol index: 34.5 mL/m2
LA vol: 78.9 mL
LV E/e' medial: 9.16
LV E/e'average: 9.16
LV PW d: 11.2 mm — AB (ref 0.6–1.1)
LV e' LATERAL: 7.62 cm/s
LVOT area: 3.8 cm2
LVOT diameter: 22 mm
Lateral S' vel: 11.5 cm/s
MV Dec: 218
MV pk A vel: 72.8 m/s
MV pk E vel: 69.8 m/s
Reg peak vel: 239 cm/s
TDI e' lateral: 7.62
TDI e' medial: 7.29
TR max vel: 239 cm/s

## 2016-08-02 ENCOUNTER — Telehealth: Payer: Self-pay | Admitting: Cardiology

## 2016-08-02 NOTE — Telephone Encounter (Signed)
Notes recorded by Consuelo Pandy, PA-C on 07/20/2016 at 8:41 AM EDT Cardiac ultrasound is ok. Overall pump function is normal. No significant structural abnormalities.  Left message for patient to call back.

## 2016-08-02 NOTE — Telephone Encounter (Signed)
New message      Calling to get echo results.  Pt states that no one called him to give him the results.  Please call

## 2016-08-03 NOTE — Telephone Encounter (Signed)
LM TO CALL BACK ./CY 

## 2016-08-04 NOTE — Telephone Encounter (Signed)
Patient is returning your call,thanks. °

## 2016-08-05 NOTE — Telephone Encounter (Signed)
I have called this pt before and he asked for [place] not to ever call him again, so, I don't feel that I should call the pt.

## 2016-08-06 ENCOUNTER — Other Ambulatory Visit: Payer: Self-pay | Admitting: *Deleted

## 2016-08-06 ENCOUNTER — Other Ambulatory Visit: Payer: Self-pay | Admitting: Pharmacist

## 2016-08-06 ENCOUNTER — Telehealth: Payer: Self-pay | Admitting: *Deleted

## 2016-08-06 MED ORDER — RIVAROXABAN 20 MG PO TABS: 20.0000 mg | ORAL_TABLET | Freq: Every day | ORAL | 5 refills | Status: DC

## 2016-08-06 NOTE — Telephone Encounter (Signed)
Patient came to the office to request a refill and samples of xarelto. I had provided him with a patient assistance application but he stated that he is still employed and feels that his income is to high to qualify. Starting in July he will no longer be working so he stated that he will apply at that time if necessary. I also provided him some samples.

## 2016-09-30 ENCOUNTER — Encounter: Payer: Self-pay | Admitting: Cardiology

## 2016-09-30 ENCOUNTER — Ambulatory Visit (INDEPENDENT_AMBULATORY_CARE_PROVIDER_SITE_OTHER): Payer: Medicare Other | Admitting: Cardiology

## 2016-09-30 VITALS — BP 120/70 | HR 78 | Ht 75.75 in | Wt 215.4 lb

## 2016-09-30 DIAGNOSIS — I4510 Unspecified right bundle-branch block: Secondary | ICD-10-CM

## 2016-09-30 DIAGNOSIS — I4892 Unspecified atrial flutter: Secondary | ICD-10-CM

## 2016-09-30 NOTE — Patient Instructions (Signed)

## 2016-09-30 NOTE — Progress Notes (Signed)
Cardiology Office Note    Date:  09/30/2016   ID:  Treshaun, Carrico 30-Jun-1940, MRN [id number]  PCP:  Darcus Austin, MD  Cardiologist:  Fransico Him, MD   Chief Complaint  Patient presents with  . Follow-up    atrial flutter, RBBB    History of Present Illness:  Logan Aguirre is a 76 y.o. male with a history of chronic RBBB, normal coronary arteries, SVT and atrial flutter on Xarelto for CHA2DS2VASC score of 4 (prior TIA and age >51).  He is here today for followup and is doing well.  He denies any chest pain or pressure, SOB, DOE, PND, orthopnea, dizziness, palpitations, LE edema or synope.  He had had some problems with nosebleeds after going on NOAC but this has resolved.   Past Medical History:  Diagnosis Date  . Atrial flutter ([place])    in setting of URI now on Xarelto for a CHADS2VASC score of 4  . Colon polyp   . GERD (gastroesophageal reflux disease)   . Gout   . Paroxysmal supraventricular tachycardia ([place])    Dr Radford Pax  . Pure hypercholesterolemia   . Right bundle branch block    normal coronary arteries   . 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 CARDIAC CATHETERIZATION     normal coronary arteries  . LEFT HEART CATHETERIZATION WITH CORONARY ANGIOGRAM N/A 03/20/2014   Procedure: LEFT HEART CATHETERIZATION WITH CORONARY ANGIOGRAM;  Surgeon: Burnell Blanks, MD;  Location: [facility] CATH LAB;  Service: Cardiovascular;  Laterality: N/A;    Current Medications: Current Meds  Medication Sig  . allopurinol (ZYLOPRIM) 300 MG tablet take 1 tablet by mouth daily  . atorvastatin (LIPITOR) 10 MG tablet Take 1 tablet (10 mg total) by mouth daily.  . benzonatate (TESSALON) 100 MG capsule Take 1 capsule (100 mg total) by mouth every 8 (eight) hours.  . finasteride (PROSCAR) 5 MG tablet Take 1 tablet by mouth daily.  . fluticasone (FLONASE) 50 MCG/ACT nasal spray Place 1 spray into both  nostrils as needed for allergies.   Marland Kitchen omeprazole (PRILOSEC) 20 MG capsule Take 1 capsule (20 mg total) by mouth daily.  . rivaroxaban (XARELTO) 20 MG TABS tablet Take 1 tablet (20 mg total) by mouth daily with supper.  . tamsulosin (FLOMAX) 0.4 MG CAPS capsule Take 1 capsule by mouth daily.  . verapamil (CALAN-SR) 240 MG CR tablet Take 1 tablet (240 mg total) by mouth at bedtime.    Allergies:   Amoxicillin; Shrimp [shellfish allergy]; and Zantac [ranitidine hcl]   Social History   Social History  . Marital status: Married    Spouse name: N/A  . Number of children: N/A  . Years of education: N/A   Social History Main Topics  . Smoking status: Current Some Day Smoker    Types: Cigars  . Smokeless tobacco: Never Used  . Alcohol use 1.8 oz/week    3 Glasses of wine per week  . Drug use: No  . Sexual activity: Not on file   Other Topics Concern  . Not on file   Social History Narrative   Current smoker cigar (occasional)   Alcohol -yes wine 3x a week   No caffeine    No recreational drug use   Diet yes- diet no salt ,no caffeine   Occupation principal,teacher   Martial status   Children 2  Family History:  The patient's family history includes COPD in his father; CVA in his brother; Colon cancer in his father; Multiple myeloma in his mother; Suicidality in his brother.   ROS:   Please see the history of present illness.    ROS All other systems reviewed and are negative.  No flowsheet data found.     PHYSICAL EXAM:   VS:  BP 120/70 (BP Location: Left Arm, Patient Position: Sitting, Cuff Size: Normal)   Pulse 78   Ht 6' 3.75" (1.924 m)   Wt 215 lb 6.4 oz (97.7 kg)   SpO2 99% Comment: at rest  BMI 26.39 kg/m    GEN: Well nourished, well developed, in no acute distress  HEENT: normal  Neck: no JVD, carotid bruits, or masses Cardiac: RRR; no murmurs, rubs, or gallops,no edema.  Intact distal pulses bilaterally.  Respiratory:  clear to auscultation  bilaterally, normal work of breathing GI: soft, nontender, nondistended, + BS MS: no deformity or atrophy  Skin: warm and dry, no rash Neuro:  Alert and Oriented x 3, Strength and sensation are intact Psych: euthymic mood, full affect  Wt Readings from Last 3 Encounters:  09/30/16 215 lb 6.4 oz (97.7 kg)  06/29/16 216 lb 8 oz (98.2 kg)  03/12/15 219 lb 9.6 oz (99.6 kg)      Studies/Labs Reviewed:   EKG:  EKG is not ordered today.    Recent Labs: 06/28/2016: ALT 26; BUN 15; Creatinine, Ser 1.29; Hemoglobin 15.9; Platelets 167; Potassium 4.1; Sodium 136 06/29/2016: Magnesium 2.3; TSH 1.190   Lipid Panel    Component Value Date/Time   CHOL  11/07/2007 2355    173        ATP III CLASSIFICATION:  <200     mg/dL   Desirable  200-239  mg/dL   Borderline High  >=240    mg/dL   High   TRIG 144 11/07/2007 2355   HDL 27 (L) 11/07/2007 2355   CHOLHDL 6.4 11/07/2007 2355   VLDL 29 11/07/2007 2355   LDLCALC (H) 11/07/2007 2355    117        Total Cholesterol/HDL:CHD Risk Coronary Heart Disease Risk Table                     Men   Women  1/2 Average Risk   3.4   3.3    Additional studies/ records that were reviewed today include:  none    ASSESSMENT:    1. Atrial flutter, unspecified type ([place])   2. RBBB      PLAN:  In order of problems listed above:  1. Atrial flutter now in NSR.  He will continue on Xarelto for CHADSVASC2 of 4 and Verapamil. 2. RBBB - he is asymptomatic with normal echo earlier this year.    Medication Adjustments/Labs and Tests Ordered: Current medicines are reviewed at length with the patient today.  Concerns regarding medicines are outlined above.  Medication changes, Labs and Tests ordered today are listed in the Patient Instructions below.  There are no Patient Instructions on file for this visit.   Signed, Fransico Him, MD  09/30/2016 8:09 AM    [address], [place], [place]  [address] Phone: [phone]; Fax: [phone]

## 2017-01-09 DIAGNOSIS — Z23 Encounter for immunization: Secondary | ICD-10-CM | POA: Diagnosis not present

## 2017-01-26 DIAGNOSIS — Z136 Encounter for screening for cardiovascular disorders: Secondary | ICD-10-CM | POA: Diagnosis not present

## 2017-01-26 DIAGNOSIS — E7800 Pure hypercholesterolemia, unspecified: Secondary | ICD-10-CM | POA: Diagnosis not present

## 2017-01-26 DIAGNOSIS — Z5181 Encounter for therapeutic drug level monitoring: Secondary | ICD-10-CM | POA: Diagnosis not present

## 2017-01-26 DIAGNOSIS — M109 Gout, unspecified: Secondary | ICD-10-CM | POA: Diagnosis not present

## 2017-01-26 DIAGNOSIS — K219 Gastro-esophageal reflux disease without esophagitis: Secondary | ICD-10-CM | POA: Diagnosis not present

## 2017-01-26 DIAGNOSIS — I471 Supraventricular tachycardia: Secondary | ICD-10-CM | POA: Diagnosis not present

## 2017-01-26 DIAGNOSIS — Z8679 Personal history of other diseases of the circulatory system: Secondary | ICD-10-CM | POA: Diagnosis not present

## 2017-01-26 DIAGNOSIS — Z0000 Encounter for general adult medical examination without abnormal findings: Secondary | ICD-10-CM | POA: Diagnosis not present

## 2017-02-03 ENCOUNTER — Other Ambulatory Visit: Payer: Self-pay | Admitting: Cardiology

## 2017-02-03 NOTE — Telephone Encounter (Signed)
Xarelto 20mg  request received; pt is 76 yrs, wt-97.7kg, Crea-1.29 on 06/28/16, last seen by Dr. Radford Pax on 09/30/16, CrCl-65.37ml/min; will send in refill request.

## 2017-03-15 ENCOUNTER — Encounter: Payer: Self-pay | Admitting: Cardiology

## 2017-03-23 ENCOUNTER — Ambulatory Visit: Payer: Medicare Other | Admitting: Cardiology

## 2017-04-13 DIAGNOSIS — R972 Elevated prostate specific antigen [PSA]: Secondary | ICD-10-CM | POA: Diagnosis not present

## 2017-04-20 DIAGNOSIS — R972 Elevated prostate specific antigen [PSA]: Secondary | ICD-10-CM | POA: Diagnosis not present

## 2017-04-20 DIAGNOSIS — N401 Enlarged prostate with lower urinary tract symptoms: Secondary | ICD-10-CM | POA: Diagnosis not present

## 2017-04-20 DIAGNOSIS — R3912 Poor urinary stream: Secondary | ICD-10-CM | POA: Diagnosis not present

## 2017-05-12 ENCOUNTER — Ambulatory Visit: Payer: Medicare Other | Admitting: Cardiology

## 2017-05-29 NOTE — Progress Notes (Signed)
Cardiology Office Note:    Date:  05/30/2017   ID:  Lorrin, Nawrot 02/18/1941, MRN [id number]  PCP:  Darcus Austin, MD  Cardiologist:  No primary care provider on file.    Referring MD: Darcus Austin, MD   Chief Complaint  Patient presents with  . Atrial Flutter    History of Present Illness:    Logan Aguirre is a 77 y.o. male with a hx of chronic RBBB, normal coronary arteries, SVT and atrial flutter on Xarelto for CHA2DS2VASC score of 4 (prior TIA and age >80).  He is here today for followup and is doing well.  He denies any chest pain or pressure, SOB, DOE, PND, orthopnea, LE edema, dizziness, palpitations or syncope. He is compliant with his meds and is tolerating meds with no SE.  He denies any problems with excessive bleeding.  Past Medical History:  Diagnosis Date  . Atrial flutter ([place])    in setting of URI now on Xarelto for a CHADS2VASC score of 4  . Colon polyp   . GERD (gastroesophageal reflux disease)   . Gout   . Paroxysmal supraventricular tachycardia ([place])    Dr Radford Pax  . Pure hypercholesterolemia   . Right bundle branch block    normal coronary arteries   . 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 CARDIAC CATHETERIZATION     normal coronary arteries  . LEFT HEART CATHETERIZATION WITH CORONARY ANGIOGRAM N/A 03/20/2014   Procedure: LEFT HEART CATHETERIZATION WITH CORONARY ANGIOGRAM;  Surgeon: Burnell Blanks, MD;  Location: [facility] CATH LAB;  Service: Cardiovascular;  Laterality: N/A;    Current Medications: Current Meds  Medication Sig  . allopurinol (ZYLOPRIM) 300 MG tablet take 1 tablet by mouth daily  . atorvastatin (LIPITOR) 10 MG tablet Take 1 tablet (10 mg total) by mouth daily.  . finasteride (PROSCAR) 5 MG tablet Take 1 tablet by mouth daily.  . fluticasone (FLONASE) 50 MCG/ACT nasal spray Place 1 spray into both nostrils daily as needed for allergies or rhinitis.    Marland Kitchen omeprazole (PRILOSEC) 20 MG capsule Take 1 capsule (20 mg total) by mouth daily.  . tamsulosin (FLOMAX) 0.4 MG CAPS capsule Take 1 capsule by mouth daily.  . verapamil (CALAN-SR) 240 MG CR tablet Take 1 tablet (240 mg total) by mouth at bedtime.  Alveda Reasons 20 MG TABS tablet TAKE 1 TABLET(20 MG) BY MOUTH DAILY WITH SUPPER     Allergies:   Amoxicillin; Shrimp [shellfish allergy]; and Zantac [ranitidine hcl]   Social History   Socioeconomic History  . Marital status: Married    Spouse name: None  . Number of children: None  . Years of education: None  . Highest education level: None  Social Needs  . Financial resource strain: None  . Food insecurity - worry: None  . Food insecurity - inability: None  . Transportation needs - medical: None  . Transportation needs - non-medical: None  Occupational History  . None  Tobacco Use  . Smoking status: Current Some Day Smoker    Types: Cigars  . Smokeless tobacco: Never Used  Substance and Sexual Activity  . Alcohol use: Yes    Alcohol/week: 1.8 oz    Types: 3 Glasses of wine per week  . Drug use: No  . Sexual activity: None  Other Topics Concern  . None  Social History Narrative   Current smoker cigar (  occasional)   Alcohol -yes wine 3x a week   No caffeine    No recreational drug use   Diet yes- diet no salt ,no caffeine   Occupation Corporate investment banker status   Children 2     Family History: The patient's family history includes COPD in his father; CVA in his brother; Colon cancer in his father; Multiple myeloma in his mother; Suicidality in his brother.  ROS:   Please see the history of present illness.    ROS  All other systems reviewed and negative.   EKGs/Labs/Other Studies Reviewed:    The following studies were reviewed today: none  EKG:  EKG is ordered today and showed atrial flutter with variable block and HR 64bpm and RBBB Recent Labs: 06/28/2016: ALT 26; BUN 15; Creatinine, Ser 1.29; Hemoglobin  15.9; Platelets 167; Potassium 4.1; Sodium 136 06/29/2016: Magnesium 2.3; TSH 1.190   Recent Lipid Panel    Component Value Date/Time   CHOL  11/07/2007 2355    173        ATP III CLASSIFICATION:  <200     mg/dL   Desirable  200-239  mg/dL   Borderline High  >=240    mg/dL   High   TRIG 144 11/07/2007 2355   HDL 27 (L) 11/07/2007 2355   CHOLHDL 6.4 11/07/2007 2355   VLDL 29 11/07/2007 2355   LDLCALC (H) 11/07/2007 2355    117        Total Cholesterol/HDL:CHD Risk Coronary Heart Disease Risk Table                     Men   Women  1/2 Average Risk   3.4   3.3    Physical Exam:    VS:  BP 116/62   Pulse 84   Ht _0  (1.93 m)   Wt 221 lb 9.6 oz (100.5 kg)   BMI 26.97 kg/m     Wt Readings from Last 3 Encounters:  05/30/17 221 lb 9.6 oz (100.5 kg)  09/30/16 215 lb 6.4 oz (97.7 kg)  06/29/16 216 lb 8 oz (98.2 kg)     GEN:  Well nourished, well developed in no acute distress HEENT: Normal NECK: No JVD; No carotid bruits LYMPHATICS: No lymphadenopathy CARDIAC: irregularly irregular, no murmurs, rubs, gallops RESPIRATORY:  Clear to auscultation without rales, wheezing or rhonchi  ABDOMEN: Soft, non-tender, non-distended MUSCULOSKELETAL:  No edema; No deformity  SKIN: Warm and dry NEUROLOGIC:  Alert and oriented x 3 PSYCHIATRIC:  Normal affect   ASSESSMENT:    1. Paroxysmal supraventricular tachycardia ([place])   2. Syncope and collapse   3. RBBB   4. Atrial flutter, unspecified type ([place])    PLAN:    In order of problems listed above:  1.  PSVT - he has not had any further episodes of palpitations. He will continue on Verapamil.   2.  Syncope- he has not had any reoccurrence  3.  RBBB - chronic with normal LVF on echo 07/2016 and normal coronary arteries by cath.  4.  Paroxysmal atrial flutter - he is in atrial flutter with CVR today.  We discussed rhythm vs. Rate control.  He is completely asymptomatic and does not want to add on any additional meds so we are  going to pursue rate control.   He will continue on Xarelto for CHADS2VASC score of 4. He will continue on Verapamil SR 21m daily. His last creatinine was stable at 1.06 in  the fall.  I will repeat a BMET and CBC since he is on a NOAC.   Medication Adjustments/Labs and Tests Ordered: Current medicines are reviewed at length with the patient today.  Concerns regarding medicines are outlined above.  No orders of the defined types were placed in this encounter.  No orders of the defined types were placed in this encounter.   Signed, Fransico Him, MD  05/30/2017 8:36 AM    [facility]

## 2017-05-30 ENCOUNTER — Other Ambulatory Visit: Payer: Self-pay

## 2017-05-30 ENCOUNTER — Encounter: Payer: Self-pay | Admitting: Cardiology

## 2017-05-30 ENCOUNTER — Ambulatory Visit (INDEPENDENT_AMBULATORY_CARE_PROVIDER_SITE_OTHER): Payer: Medicare Other | Admitting: Cardiology

## 2017-05-30 VITALS — BP 116/62 | HR 84 | Ht 76.0 in | Wt 221.6 lb

## 2017-05-30 DIAGNOSIS — R55 Syncope and collapse: Secondary | ICD-10-CM | POA: Diagnosis not present

## 2017-05-30 DIAGNOSIS — I471 Supraventricular tachycardia, unspecified: Secondary | ICD-10-CM

## 2017-05-30 DIAGNOSIS — I4892 Unspecified atrial flutter: Secondary | ICD-10-CM

## 2017-05-30 DIAGNOSIS — I4510 Unspecified right bundle-branch block: Secondary | ICD-10-CM

## 2017-05-30 LAB — CBC
HEMATOCRIT: 42.3 % (ref 37.5–51.0)
HEMOGLOBIN: 14.7 g/dL (ref 13.0–17.7)
MCH: 31.9 pg (ref 26.6–33.0)
MCHC: 34.8 g/dL (ref 31.5–35.7)
MCV: 92 fL (ref 79–97)
Platelets: 248 10*3/uL (ref 150–379)
RBC: 4.61 x10E6/uL (ref 4.14–5.80)
RDW: 14 % (ref 12.3–15.4)
WBC: 7.9 10*3/uL (ref 3.4–10.8)

## 2017-05-30 LAB — BASIC METABOLIC PANEL
BUN/Creatinine Ratio: 16 (ref 10–24)
BUN: 19 mg/dL (ref 8–27)
CALCIUM: 8.6 mg/dL (ref 8.6–10.2)
CO2: 21 mmol/L (ref 20–29)
Chloride: 104 mmol/L (ref 96–106)
Creatinine, Ser: 1.18 mg/dL (ref 0.76–1.27)
GFR calc Af Amer: 68 mL/min/{1.73_m2} (ref >59)
GFR, EST NON AFRICAN AMERICAN: 59 mL/min/{1.73_m2} — AB (ref >59)
Glucose: 131 mg/dL — ABNORMAL HIGH (ref 65–99)
POTASSIUM: 4.3 mmol/L (ref 3.5–5.2)
Sodium: 142 mmol/L (ref 134–144)

## 2017-05-30 NOTE — Patient Instructions (Signed)
Medication Instructions:  Your physician recommends that you continue on your current medications as directed. Please refer to the Current Medication list given to you today.  Labwork: Today for kidney function and complete blood count  Testing/Procedures: None ordered   Follow-Up: Your physician wants you to follow-up in: 6 months with Dr. Turner. You will receive a reminder letter in the mail two months in advance. If you don't receive a letter, please call our office to schedule the follow-up appointment.  Any Other Special Instructions Will Be Listed Below (If Applicable).     If you need a refill on your cardiac medications before your next appointment, please call your pharmacy.   

## 2017-07-11 ENCOUNTER — Other Ambulatory Visit: Payer: Self-pay | Admitting: Cardiology

## 2017-07-11 NOTE — Telephone Encounter (Signed)
Xarelto 20mg  refill request; pt is 77 yrs old, Crea-1.18 on 05/30/17, Wt-100.5kg, last seen by Dr. Radford Pax on 05/30/17, CrCl-73.32ml/min; will send in refill request.

## 2017-07-14 DIAGNOSIS — R3912 Poor urinary stream: Secondary | ICD-10-CM | POA: Diagnosis not present

## 2017-07-14 DIAGNOSIS — N401 Enlarged prostate with lower urinary tract symptoms: Secondary | ICD-10-CM | POA: Diagnosis not present

## 2017-07-14 DIAGNOSIS — R3914 Feeling of incomplete bladder emptying: Secondary | ICD-10-CM | POA: Diagnosis not present

## 2017-07-21 DIAGNOSIS — R3914 Feeling of incomplete bladder emptying: Secondary | ICD-10-CM | POA: Diagnosis not present

## 2017-07-21 DIAGNOSIS — N401 Enlarged prostate with lower urinary tract symptoms: Secondary | ICD-10-CM | POA: Diagnosis not present

## 2017-10-12 DIAGNOSIS — H25813 Combined forms of age-related cataract, bilateral: Secondary | ICD-10-CM | POA: Diagnosis not present

## 2017-10-12 DIAGNOSIS — H02889 Meibomian gland dysfunction of unspecified eye, unspecified eyelid: Secondary | ICD-10-CM | POA: Diagnosis not present

## 2017-10-15 ENCOUNTER — Emergency Department (HOSPITAL_COMMUNITY)
Admission: EM | Admit: 2017-10-15 | Discharge: 2017-10-15 | Disposition: A | Discharge status: Home / Self Care | Payer: Medicare Other | Attending: Emergency Medicine | Admitting: Emergency Medicine

## 2017-10-15 ENCOUNTER — Emergency Department (HOSPITAL_COMMUNITY): Payer: Medicare Other

## 2017-10-15 ENCOUNTER — Encounter (HOSPITAL_COMMUNITY): Payer: Self-pay | Admitting: *Deleted

## 2017-10-15 ENCOUNTER — Other Ambulatory Visit: Payer: Self-pay

## 2017-10-15 DIAGNOSIS — S62633A Displaced fracture of distal phalanx of left middle finger, initial encounter for closed fracture: Secondary | ICD-10-CM | POA: Diagnosis not present

## 2017-10-15 DIAGNOSIS — S61313A Laceration without foreign body of left middle finger with damage to nail, initial encounter: Secondary | ICD-10-CM | POA: Diagnosis not present

## 2017-10-15 DIAGNOSIS — W230XXA Caught, crushed, jammed, or pinched between moving objects, initial encounter: Secondary | ICD-10-CM | POA: Diagnosis not present

## 2017-10-15 DIAGNOSIS — L608 Other nail disorders: Secondary | ICD-10-CM

## 2017-10-15 DIAGNOSIS — Y999 Unspecified external cause status: Secondary | ICD-10-CM | POA: Insufficient documentation

## 2017-10-15 DIAGNOSIS — Y939 Activity, unspecified: Secondary | ICD-10-CM | POA: Diagnosis not present

## 2017-10-15 DIAGNOSIS — S62663B Nondisplaced fracture of distal phalanx of left middle finger, initial encounter for open fracture: Secondary | ICD-10-CM | POA: Insufficient documentation

## 2017-10-15 DIAGNOSIS — S62639B Displaced fracture of distal phalanx of unspecified finger, initial encounter for open fracture: Secondary | ICD-10-CM

## 2017-10-15 DIAGNOSIS — I4892 Unspecified atrial flutter: Secondary | ICD-10-CM | POA: Diagnosis not present

## 2017-10-15 DIAGNOSIS — S62633B Displaced fracture of distal phalanx of left middle finger, initial encounter for open fracture: Secondary | ICD-10-CM | POA: Diagnosis not present

## 2017-10-15 DIAGNOSIS — S62662A Nondisplaced fracture of distal phalanx of right middle finger, initial encounter for closed fracture: Secondary | ICD-10-CM | POA: Insufficient documentation

## 2017-10-15 DIAGNOSIS — S60039A Contusion of unspecified middle finger without damage to nail, initial encounter: Secondary | ICD-10-CM | POA: Diagnosis not present

## 2017-10-15 DIAGNOSIS — Z79899 Other long term (current) drug therapy: Secondary | ICD-10-CM | POA: Insufficient documentation

## 2017-10-15 DIAGNOSIS — Y92094 Garage of other non-institutional residence as the place of occurrence of the external cause: Secondary | ICD-10-CM | POA: Insufficient documentation

## 2017-10-15 DIAGNOSIS — F17210 Nicotine dependence, cigarettes, uncomplicated: Secondary | ICD-10-CM | POA: Insufficient documentation

## 2017-10-15 DIAGNOSIS — S62632A Displaced fracture of distal phalanx of right middle finger, initial encounter for closed fracture: Secondary | ICD-10-CM | POA: Diagnosis not present

## 2017-10-15 DIAGNOSIS — Z8673 Personal history of transient ischemic attack (TIA), and cerebral infarction without residual deficits: Secondary | ICD-10-CM | POA: Diagnosis not present

## 2017-10-15 DIAGNOSIS — S62639A Displaced fracture of distal phalanx of unspecified finger, initial encounter for closed fracture: Secondary | ICD-10-CM

## 2017-10-15 DIAGNOSIS — Z7901 Long term (current) use of anticoagulants: Secondary | ICD-10-CM | POA: Insufficient documentation

## 2017-10-15 DIAGNOSIS — I471 Supraventricular tachycardia: Secondary | ICD-10-CM | POA: Diagnosis not present

## 2017-10-15 LAB — CBG MONITORING, ED: Glucose-Capillary: 108 mg/dL — ABNORMAL HIGH (ref 65–99)

## 2017-10-15 MED ORDER — SULFAMETHOXAZOLE-TRIMETHOPRIM 800-160 MG PO TABS: 1.0000 | ORAL_TABLET | Freq: Two times a day (BID) | ORAL | 0 refills | Status: AC

## 2017-10-15 MED ORDER — LIDOCAINE HCL (PF) 1 % IJ SOLN
INTRAMUSCULAR | Status: AC
  Filled 2017-10-15: qty 30

## 2017-10-15 MED ORDER — LORAZEPAM 1 MG PO TABS
1.0000 mg | ORAL_TABLET | Freq: Once | ORAL | Status: AC
  Administered 2017-10-15: 1 mg via ORAL
  Filled 2017-10-15: qty 1

## 2017-10-15 MED ORDER — CEPHALEXIN 500 MG PO CAPS: 500.0000 mg | ORAL_CAPSULE | Freq: Once | ORAL | Status: DC

## 2017-10-15 MED ORDER — LIDOCAINE HCL (PF) 1 % IJ SOLN
30.0000 mL | Freq: Once | INTRAMUSCULAR | Status: AC
  Administered 2017-10-15: 30 mL via INTRADERMAL

## 2017-10-15 MED ORDER — SULFAMETHOXAZOLE-TRIMETHOPRIM 800-160 MG PO TABS
1.0000 | ORAL_TABLET | Freq: Once | ORAL | Status: AC
  Administered 2017-10-15: 1 via ORAL
  Filled 2017-10-15: qty 1

## 2017-10-15 NOTE — ED Notes (Signed)
Dr.Little at bedside to assess patient due to shaking of hands. Pt reports taking his Verapamil at 2130 and Dr.Little aware of pt heart rate.

## 2017-10-15 NOTE — ED Provider Notes (Signed)
[facility] DEPT Provider Note   CSN: [id number] Arrival date & time: 10/15/17  1538     History   Chief Complaint Chief Complaint  Patient presents with  . Hand Injury    HPI Logan Aguirre is a 77 y.o. male.  77 year old male with past medical history including atrial fibrillation on Xarelto, gout, TIA who presents with bilateral middle finger injuries.  Around 11am today, he got both middle fingers smashed in a garage door. He went to PCP at [facility] where plain films showed L middle finger fracture, concern for open fracture because he has a finger laceration associated with the injury.  His physician spoke with hand surgeon Dr. Fredna Dow, who recommended going to the ED for evaluation.  Currently his pain is throbbing and dull, 3/10 in intensity.    Tetanus is UTD. No medications PTA.   Hand Injury      Past Medical History:  Diagnosis Date  . Atrial flutter ([place])    in setting of URI now on Xarelto for a CHADS2VASC score of 4  . Colon polyp   . GERD (gastroesophageal reflux disease)   . Gout   . Paroxysmal supraventricular tachycardia ([place])    Dr Radford Pax  . Pure hypercholesterolemia   . Right bundle branch block    normal coronary arteries   . 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 Atrial flutter ([place])   . Abnormal nuclear stress test 03/18/2014  . Fatigue due to excessive exertion 03/18/2014  . RBBB 03/14/2013  . Paroxysmal supraventricular tachycardia ([place])   . Pure hypercholesterolemia   . Syncope and collapse   . GERD (gastroesophageal reflux disease)     Past Surgical History:  Procedure Laterality Date  . CARDIAC CATHETERIZATION     normal coronary arteries  . LEFT HEART CATHETERIZATION WITH CORONARY ANGIOGRAM N/A 03/20/2014   Procedure: LEFT HEART CATHETERIZATION WITH CORONARY ANGIOGRAM;  Surgeon: Burnell Blanks, MD;  Location: [facility] CATH LAB;   Service: Cardiovascular;  Laterality: N/A;        Home Medications    Prior to Admission medications   Medication Sig Start Date End Date Taking? Authorizing Provider  allopurinol (ZYLOPRIM) 300 MG tablet take 1 tablet by mouth daily 02/17/15  Yes [provider]  atorvastatin (LIPITOR) 10 MG tablet Take 1 tablet (10 mg total) by mouth daily. 04/19/14  Yes Burtis Junes, NP  docusate sodium (COLACE) 100 MG capsule Take 100 mg by mouth daily.   Yes [provider]  finasteride (PROSCAR) 5 MG tablet Take 1 tablet by mouth daily. 02/04/14  Yes [provider]  fluticasone (FLONASE) 50 MCG/ACT nasal spray Place 1 spray into both nostrils daily as needed for allergies or rhinitis.   Yes [provider]  omeprazole (PRILOSEC) 20 MG capsule Take 1 capsule (20 mg total) by mouth daily. 04/19/14  Yes Burtis Junes, NP  tamsulosin (FLOMAX) 0.4 MG CAPS capsule Take 1 capsule by mouth daily. 02/04/14  Yes [provider]  verapamil (CALAN-SR) 240 MG CR tablet Take 1 tablet (240 mg total) by mouth at bedtime. 04/19/14  Yes Gerhardt, Marlane Hatcher, NP  XARELTO 20 MG TABS tablet TAKE 1 TABLET BY MOUTH EVERY DAY WITH SUPPER 07/11/17  Yes Turner, Eber Hong, MD  sulfamethoxazole-trimethoprim (BACTRIM DS,SEPTRA DS) 800-160 MG tablet Take 1 tablet by mouth 2 (two) times daily for 7  days. 10/15/17 10/22/17  Little, Wenda Overland, MD    Family History Family History  Problem Relation Age of Onset  . Multiple myeloma Mother   . COPD Father   . Colon cancer Father   . Suicidality Brother   . CVA Brother     Social History Social History   Tobacco Use  . Smoking status: Current Some Day Smoker    Types: Cigars  . Smokeless tobacco: Never Used  Substance Use Topics  . Alcohol use: Yes    Alcohol/week: 1.8 oz    Types: 3 Glasses of wine per week  . Drug use: No     Allergies   Amoxicillin; Shrimp [shellfish allergy]; and Zantac [ranitidine hcl]   Review  of Systems Review of Systems  Musculoskeletal: Positive for joint swelling.  Skin: Positive for wound.  Neurological: Negative for numbness.     Physical Exam Updated Vital Signs BP (!) 152/111 (BP Location: Right Arm)   Pulse (!) 125   Temp 98.1 F (36.7 C) (Oral)   Resp 18   Ht _0  (1.905 m)   Wt 94.8 kg (209 lb)   SpO2 99%   BMI 26.12 kg/m   Physical Exam  Constitutional: He is oriented to person, place, and time. He appears well-developed and well-nourished. No distress.  HENT:  Head: Normocephalic and atraumatic.  Eyes: Conjunctivae are normal.  Neck: Neck supple.  Musculoskeletal: He exhibits edema and tenderness.  Edema, ecchymosis, and swelling of b/l distal 3rd fingers; R finger w/ 100% subungal hematoma, 0.2cm laceration on palmar surface of fingertip; L finger w/ 75% subungal hematoma, 1cm laceration on palmar surface of fingertip  Neurological: He is alert and oriented to person, place, and time.  Skin: Skin is warm and dry.  Psychiatric: He has a normal mood and affect. Judgment normal.  Nursing note and vitals reviewed.    ED Treatments / Results  Labs (all labs ordered are listed, but only abnormal results are displayed) Labs Reviewed - No data to display  EKG None  Radiology Dg Finger Middle Left  Result Date: 10/15/2017 CLINICAL DATA:  Middle finger got stuck in garage door with bruising and laceration of the distal phalanx. Concern for open fracture. EXAM: LEFT MIDDLE FINGER 2+V COMPARISON:  None. FINDINGS: Acute closed tuft fracture of the left middle finger is identified without significant displacement or angulation. Slight osteoarthritic joint space narrowing of the DIP and PIP joints of the left middle finger. Associated soft tissue swelling seen. No radiopaque foreign body. No soft tissue laceration or emphysema is seen. IMPRESSION: Acute, closed appearing tuft fracture of the left middle finger without significant displacement or angulation.  No joint dislocation. 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 Electronically Signed   By: Ashley Royalty M.D.   On: 10/15/2017 18:59   Dg Finger Middle Right  Result Date: 10/15/2017 CLINICAL DATA:  Third finger was caught in a garage door with bruising and laceration. EXAM: RIGHT MIDDLE FINGER 2+V COMPARISON:  Earlier same day radiographs FINDINGS: Acute, closed, tuft fracture of the right middle finger without joint dislocation. Associated soft tissue swelling without open wound or radiopaque foreign body. IMPRESSION: Acute, closed tuft fracture of the right middle finger with associated soft tissue swelling. No significant displacement or angulation. Electronically Signed   By: Ashley Royalty M.D.   On: 10/15/2017 19:03    Procedures .Marland KitchenLaceration Repair Date/Time: 10/15/2017 9:32 PM Performed by: Sharlett Iles, MD Authorized by: Sharlett Iles, MD   Consent:  Consent obtained:  Verbal   Consent given by:  Patient   Risks discussed:  Infection, pain and poor wound healing   Alternatives discussed:  No treatment Anesthesia (see MAR for exact dosages):    Anesthesia method:  Local infiltration   Local anesthetic:  Lidocaine 1% w/o epi Laceration details:    Location:  Finger   Finger location:  L long finger   Length (cm):  1 Repair type:    Repair type:  Simple Pre-procedure details:    Preparation:  Patient was prepped and draped in usual sterile fashion Treatment:    Area cleansed with:  Betadine   Amount of cleaning:  Standard   Irrigation solution:  Sterile saline   Irrigation method:  Pressure wash Skin repair:    Repair method:  Sutures   Suture size:  4-0   Suture material:  Nylon   Number of sutures:  3 Approximation:    Approximation:  Loose Post-procedure details:    Dressing:  Antibiotic ointment and non-adherent dressing   Patient tolerance of procedure:  Tolerated well, no immediate complications .Marland KitchenIncision and Drainage Date/Time: 10/15/2017 9:34  PM Performed by: Sharlett Iles, MD Authorized by: Sharlett Iles, MD   Consent:    Consent obtained:  Verbal   Consent given by:  Patient   Risks discussed:  Bleeding   Alternatives discussed:  No treatment Location:    Type:  Subungual hematoma   Location:  Upper extremity   Upper extremity location:  Finger   Finger location:  L long finger Pre-procedure details:    Procedure prep: alcohol. Anesthesia (see MAR for exact dosages):    Anesthesia method:  None Procedure type:    Complexity:  Simple Procedure details:    Incision types:  Stab incision   Drainage:  Bloody   Drainage amount:  Moderate   Wound treatment:  Wound left open   Packing materials:  None Post-procedure details:    Patient tolerance of procedure:  Tolerated well, no immediate complications Comments:     Used Bovie cautery to trephinate nail .Marland KitchenIncision and Drainage Date/Time: 10/15/2017 9:35 PM Performed by: Sharlett Iles, MD Authorized by: Sharlett Iles, MD   Consent:    Consent obtained:  Verbal   Consent given by:  Patient   Risks discussed:  Bleeding   Alternatives discussed:  No treatment Location:    Type:  Subungual hematoma   Size:  1 cm   Location:  Upper extremity   Upper extremity location:  Finger   Finger location:  R long finger Pre-procedure details:    Procedure prep: alcohol. Anesthesia (see MAR for exact dosages):    Anesthesia method:  None Procedure type:    Complexity:  Simple Procedure details:    Incision types:  Stab incision   Drainage:  Bloody   Drainage amount:  Moderate   Wound treatment:  Wound left open   Packing materials:  None Post-procedure details:    Patient tolerance of procedure:  Tolerated well, no immediate complications Comments:     Used Bovie cautery to trephinate nail   (including critical care time)  Medications Ordered in ED Medications  sulfamethoxazole-trimethoprim (BACTRIM DS,SEPTRA DS) 800-160 MG per  tablet 1 tablet (has no administration in time range)  lidocaine (PF) (XYLOCAINE) 1 % injection 30 mL (30 mLs Intradermal Given 10/15/17 2108)     Initial Impression / Assessment and Plan / ED Course  I have reviewed the triage vital signs and the nursing  notes.  Pertinent imaging results that were available during my care of the patient were reviewed by me and considered in my medical decision making (see chart for details).    Bilateral tuft fractures on x-ray, left finger does have associated laceration on palmar surface.  No bony exposure.  I discussed with Dr. Fredna Dow who advised me to repair at bedside, start patient on Septra, and follow-up in his clinic.  Trephinated both nails due to large subungual hematomas.  Repaired laceration, see procedure notes for details.  Discussed wound care instructions including suture care, follow-up plan with Dr. Fredna Dow.  Extensively reviewed return precautions including any signs or symptoms of infection.  Patient and wife voiced understanding.  Final Clinical Impressions(s) / ED Diagnoses   Final diagnoses:  Open fracture of tuft of distal phalanx of finger  Closed fracture of tuft of distal phalanx of finger  Hemorrhage of nail  Laceration of left middle finger without foreign body with damage to nail, initial encounter    ED Discharge Orders        Ordered    sulfamethoxazole-trimethoprim (BACTRIM DS,SEPTRA DS) 800-160 MG tablet  2 times daily     10/15/17 2125       Little, Wenda Overland, MD 10/15/17 2138

## 2017-10-15 NOTE — ED Notes (Signed)
Pt reporting improvement in shaking and now requesting to be discharge. Dr.Little had advised if pt had improved in sympotms pt was cleared for discharge.

## 2017-10-15 NOTE — ED Triage Notes (Signed)
Pt sent from [facility] walk in clinic for open fracture to left 3rd finger. [facility] physician consulted with orthopedic surgeon, who recommended pt come to ED for further eval and treatment.

## 2017-10-15 NOTE — ED Notes (Signed)
Dr.Little at bedside at this time to suture fingers

## 2017-10-15 NOTE — ED Notes (Signed)
At time of discharge pt reported shaking of arms and reported not eating since 1400 and denies any diabetic history. Pt given sandwich and soda. Spouse at bedside. Dr.Little to be notified.

## 2017-10-17 DIAGNOSIS — S62663A Nondisplaced fracture of distal phalanx of left middle finger, initial encounter for closed fracture: Secondary | ICD-10-CM | POA: Diagnosis not present

## 2017-10-17 DIAGNOSIS — S62662A Nondisplaced fracture of distal phalanx of right middle finger, initial encounter for closed fracture: Secondary | ICD-10-CM | POA: Diagnosis not present

## 2017-10-31 DIAGNOSIS — S62662D Nondisplaced fracture of distal phalanx of right middle finger, subsequent encounter for fracture with routine healing: Secondary | ICD-10-CM | POA: Diagnosis not present

## 2017-10-31 DIAGNOSIS — S62663D Nondisplaced fracture of distal phalanx of left middle finger, subsequent encounter for fracture with routine healing: Secondary | ICD-10-CM | POA: Diagnosis not present

## 2017-11-01 DIAGNOSIS — R3914 Feeling of incomplete bladder emptying: Secondary | ICD-10-CM | POA: Diagnosis not present

## 2017-11-07 DIAGNOSIS — R310 Gross hematuria: Secondary | ICD-10-CM | POA: Diagnosis not present

## 2017-11-07 DIAGNOSIS — R338 Other retention of urine: Secondary | ICD-10-CM | POA: Diagnosis not present

## 2017-11-14 DIAGNOSIS — S62662D Nondisplaced fracture of distal phalanx of right middle finger, subsequent encounter for fracture with routine healing: Secondary | ICD-10-CM | POA: Diagnosis not present

## 2017-11-14 DIAGNOSIS — S62663D Nondisplaced fracture of distal phalanx of left middle finger, subsequent encounter for fracture with routine healing: Secondary | ICD-10-CM | POA: Diagnosis not present

## 2017-11-16 DIAGNOSIS — R319 Hematuria, unspecified: Secondary | ICD-10-CM | POA: Diagnosis not present

## 2017-11-16 DIAGNOSIS — R310 Gross hematuria: Secondary | ICD-10-CM | POA: Diagnosis not present

## 2017-11-22 ENCOUNTER — Telehealth: Payer: Self-pay | Admitting: Cardiology

## 2017-11-22 DIAGNOSIS — R310 Gross hematuria: Secondary | ICD-10-CM | POA: Diagnosis not present

## 2017-11-22 DIAGNOSIS — N401 Enlarged prostate with lower urinary tract symptoms: Secondary | ICD-10-CM | POA: Diagnosis not present

## 2017-11-22 DIAGNOSIS — R3914 Feeling of incomplete bladder emptying: Secondary | ICD-10-CM | POA: Diagnosis not present

## 2017-11-22 NOTE — Telephone Encounter (Signed)
New Message   Pt c/o medication issue:  1. Name of Medication: verapamil (CALAN-SR) 240 MG CR tablet  2. How are you currently taking this medication (dosage and times per day)? Take 1 tablet (240 mg total) by mouth at bedtime  3. Are you having a reaction (difficulty breathing--STAT)? no  4. What is your medication issue? Pt states he switched pharmacies about a month to two months ago and they have been giving him 120 mg tablets instead of 240, states he has been taking 1 tablet not noticing that the bottle says to take 2. Pt says he has been doing this for almost 2 months and wants to know should he go back to taking 240mg . Please call

## 2017-11-22 NOTE — Telephone Encounter (Signed)
Left message for patient to call back  

## 2017-11-28 ENCOUNTER — Telehealth: Payer: Self-pay | Admitting: Cardiology

## 2017-11-28 NOTE — Telephone Encounter (Signed)
New Message          [facility] Pre-operative Risk Assessment    Request for surgical clearance:  What type of surgery is being performed?  Cystoscopy Transurethral resection Of the prostate  1. When is this surgery scheduled? TBD  2. What type of clearance is required (medical clearance vs. Pharmacy clearance to hold med vs. Both)? Medical and Pharmacy  3. Are there any medications that need to be held prior to surgery and how long? Sheila Oats held 2/3 days prior  4. Practice name and name of physician performing surgery? [facility]  5. What is your office phone number [phone]   7.   What is your office fax number [phone]  8.   Anesthesia type (None, local, MAC, general) ? General   Felecia F Skeen 11/28/2017, 11:50 AM  _________________________________________________________________   (provider comments below)

## 2017-11-28 NOTE — Telephone Encounter (Signed)
BP and HR readings dropped off by pt.  7/2- 101/69, HR 130 7/8- 119/78, HR 90 7/23- 107/68, HR 116  Dr. Radford Pax wanted pt to come in for EKG.  Called pt and left message to call back.  Planned to schedule pt for Nurse Visit on Thursday when Dr. Radford Pax is in the office.

## 2017-11-28 NOTE — Telephone Encounter (Signed)
Pharmacy please address Xarelto.    I called pt and left a message.  It has been just 6 months since last visit.  Hx of no CAD by cath 2015.  I believe this could be phone call if he has no issues.

## 2017-11-29 NOTE — Telephone Encounter (Signed)
Dr Radford Pax pt needs cysto and TURP,  Urology would like to hold xarelto for 2-3 days but with hx of CVA Pharmacy only recommends 24 hours.  They would like you to weigh in on the xarelto.  I have a call into the pt to see how he feels, chest pain etc.

## 2017-11-29 NOTE — Telephone Encounter (Signed)
Agree should only hold for 24 hours

## 2017-11-29 NOTE — Telephone Encounter (Signed)
Left message to call back  

## 2017-11-29 NOTE — Telephone Encounter (Signed)
I called and LM for pt to call back.

## 2017-11-29 NOTE — Telephone Encounter (Signed)
Pt takes Xarelto for aflutter with CHADS2VASc score of 4 (age x2, TIA). Recommend holding Xarelto for 24 hours prior to procedure due to hx of TIA. If more than 24 hour hold is needed, will need to defer to Dr Radford Pax.

## 2017-12-01 NOTE — Telephone Encounter (Signed)
LV to call back for medical clearance

## 2017-12-02 NOTE — Telephone Encounter (Signed)
   Primary Cardiologist: Fransico Him, MD  Chart reviewed as part of pre-operative protocol coverage. Patient was contacted 12/02/2017 in reference to pre-operative risk assessment for pending surgery as outlined below.  [name] was last seen 05/2017 by Dr. Radford Pax. H/o chronic RBBB, SVT, atrial flutter, prior TIA/CVA, HLD, GERD, gout. At last OV 05/2017 he remained in rate controlled atrial flutter. Given asymptomatic nature, the strategy was decided for rate control instead of trying to achieve NSR.   However, in a separate phone note, the patient recently called in reporting that when he had recently switched pharmacies, they had been giving him the 120mg  dose instead of 240mg  and has had recent issues with elevated HR. Dr. Radford Pax had recommended he come in for a nurse visit/EKG. He's been asked to call the office to schedule the EKG but does not appear this is done yet. He does have f/u 01/03/18 with Gerrianne Scale but this is about a month away.  Preop staff: Before we can clear patient, we either need to do one of two things. Either a) move up office visit as he is due for 6 month f/u anyway, or b) make sure the nurse visit for EKG gets scheduled so further decisions for clearance can be made pending Dr. Theodosia Blender review.  I will also route this update to the surgical team to make them aware.  Charlie Pitter, PA-C 12/02/2017, 9:06 AM

## 2017-12-02 NOTE — Telephone Encounter (Signed)
Called pt re: surgical clearance and the need for a sooner appt or nurse visit for EKG. I have pt a nurse visit scheduled for Monday, 12/05/17, @ 2:00. I have left the pt a message to call me back.

## 2017-12-02 NOTE — Telephone Encounter (Signed)
Left CVM per DPR.  Advised Pt he should start taking the Verapamil correctly at 240 mg daily d/t he has had elevated heart rates (Pt in atrial flutter-verapamil for rate control).    Pt is due for 6 mo f/u.  Made appointment with Gerrianne Scale for 01/03/2018 at 1:30 pm.  Advised Pt Dr. Radford Pax would also like him to have an EKG.  Asked Pt to call office to schedule EKG.  Left this nurse name and # for call back. Pt advised to continue correct dose of verapamil and advised of upcoming appt-advised to call office to schedule EKG.  No further action at this time.

## 2017-12-05 ENCOUNTER — Ambulatory Visit (INDEPENDENT_AMBULATORY_CARE_PROVIDER_SITE_OTHER): Payer: Medicare Other

## 2017-12-05 VITALS — BP 114/70 | HR 78 | Ht 76.0 in | Wt 208.0 lb

## 2017-12-05 DIAGNOSIS — Z01818 Encounter for other preprocedural examination: Secondary | ICD-10-CM

## 2017-12-05 DIAGNOSIS — I4892 Unspecified atrial flutter: Secondary | ICD-10-CM | POA: Diagnosis not present

## 2017-12-05 NOTE — Progress Notes (Signed)
1.) Reason for visit: EKG for surgical clearance and checking patient's HR on correct dose of verapamil  2.) Name of MD requesting visit: Dr. Radford Pax  3.) H&P: Patient has history of Atrial Flutter. Patient is needing an EKG for surgical clearance. Dr. Radford Pax also requested EKG due to patient not taking correct dose of verapamil. Patient was taking verapamil 240 mg tablet daily, then got a refill that was 120 mg tablets due to pharmacy being out of 240 mg tablets. Patient had not paid attention to the dosage of the tablet and he was only taking half the dose he had been taking. This resulted in patient's HR being high, 130, 116, and 90. (see phone note 11/22/17) Patient realized he needed to take two tablets of the 120 mg to equal 240 mg. Patient started taking the correct dose and was asked to come in for an EKG.  4.) ROS related to problem: Patient's VS 114/70, HR 78, and O2 98% on room air. Patient's EKG show Atrial Flutter.  5.) Assessment and plan per MD: EKG given to [facility] PA, who is doing pre op clearance today. Will send nurse visit note to Dr. Radford Pax to review.

## 2017-12-05 NOTE — Patient Instructions (Addendum)
Please continue on your current medications as directed. Please refer to the Current Medication list given to you today.

## 2017-12-05 NOTE — Telephone Encounter (Deleted)
Called surgeon's office to confirm letter and still hasn't been received. I will fax notes via Epic and maual.

## 2017-12-06 NOTE — Telephone Encounter (Signed)
   Chart reviewed.   Patient is 77 yo male with hx of AFlutter, prior CVA, RBBB.  Last seen by Dr. Radford Pax 05/30/17. There was a recent question regarding uncontrolled HR when the patient's Rx for Verapamil was filled incorrectly.  The patient is now on the correct dose of Verapamil.    ECG 12/05/17: AFlutter, 3:1 conduction, HR 78, RBBB.  Echo 3/18:  EF 55-60, Gr 1 DD, PASP 29. LHC 11/15:  No angiographic CAD.   RCRI:  0.9  Rate is better controlled.  Risk of periop major cardiac event is low.  I left a message for the patient to call back so that we can assess for any clinical change since last visit.  Richardson Dopp, PA-C    12/06/2017 4:51 PM

## 2017-12-07 ENCOUNTER — Telehealth: Payer: Self-pay

## 2017-12-07 NOTE — Telephone Encounter (Signed)
Left message for patient to call back  

## 2017-12-07 NOTE — Telephone Encounter (Signed)
-----   Message from Sueanne Margarita, MD sent at 12/07/2017 12:52 PM EDT ----- EKG reviewed and he remains in atrial flutter with CVR.  Please find out if patient has had any CP, SOB, dizziness or LE edema.  If he is asymptomatic then he is fine to proceed with surgery but I have already discussed with his urologist that he can only be off anticoagulation without bridge for 24 hours due to prior CVA.  Fransico Him ----- Message ----- From: Michaelyn Barter, RN Sent: 12/07/2017   7:49 AM To: Sueanne Margarita, MD  See phone encounter on 7/29 for clearance  ----- Message ----- From: Sueanne Margarita, MD Sent: 12/06/2017   7:24 PM To: Michaelyn Barter, RN  Did Richardson Dopp clear patient  Traci ----- Message ----- From: Michaelyn Barter, RN Sent: 12/05/2017   3:13 PM To: Sueanne Margarita, MD

## 2017-12-07 NOTE — Progress Notes (Signed)
HR controlled. Richardson Dopp, PA-C    12/07/2017 1:33 PM

## 2017-12-12 DIAGNOSIS — S62663D Nondisplaced fracture of distal phalanx of left middle finger, subsequent encounter for fracture with routine healing: Secondary | ICD-10-CM | POA: Diagnosis not present

## 2017-12-12 DIAGNOSIS — R2231 Localized swelling, mass and lump, right upper limb: Secondary | ICD-10-CM | POA: Diagnosis not present

## 2017-12-13 NOTE — Telephone Encounter (Signed)
I called the patient. He told me Dr Radford Pax and his urologist have spoken and the plan now is an alternative to surgery. I will remove this from the pre op pool.  Kerin Ransom PA-C 12/13/2017 2:19 PM

## 2017-12-15 ENCOUNTER — Other Ambulatory Visit: Payer: Self-pay | Admitting: Orthopedic Surgery

## 2017-12-15 DIAGNOSIS — R229 Localized swelling, mass and lump, unspecified: Principal | ICD-10-CM

## 2017-12-15 DIAGNOSIS — IMO0002 Reserved for concepts with insufficient information to code with codable children: Secondary | ICD-10-CM

## 2017-12-19 DIAGNOSIS — N401 Enlarged prostate with lower urinary tract symptoms: Secondary | ICD-10-CM | POA: Diagnosis not present

## 2017-12-19 DIAGNOSIS — R3912 Poor urinary stream: Secondary | ICD-10-CM | POA: Diagnosis not present

## 2017-12-19 DIAGNOSIS — R3914 Feeling of incomplete bladder emptying: Secondary | ICD-10-CM | POA: Diagnosis not present

## 2017-12-23 ENCOUNTER — Ambulatory Visit
Admission: RE | Admit: 2017-12-23 | Discharge: 2017-12-23 | Disposition: A | Discharge status: Home / Self Care | Payer: Medicare Other | Source: Ambulatory Visit | Attending: Orthopedic Surgery | Admitting: Orthopedic Surgery

## 2017-12-23 DIAGNOSIS — R229 Localized swelling, mass and lump, unspecified: Principal | ICD-10-CM

## 2017-12-23 DIAGNOSIS — IMO0002 Reserved for concepts with insufficient information to code with codable children: Secondary | ICD-10-CM

## 2017-12-23 DIAGNOSIS — R2231 Localized swelling, mass and lump, right upper limb: Secondary | ICD-10-CM | POA: Diagnosis not present

## 2017-12-28 NOTE — Telephone Encounter (Signed)
See phone note for clearance.

## 2017-12-30 ENCOUNTER — Encounter: Payer: Self-pay | Admitting: Physician Assistant

## 2018-01-03 ENCOUNTER — Ambulatory Visit (INDEPENDENT_AMBULATORY_CARE_PROVIDER_SITE_OTHER): Payer: Medicare Other | Admitting: Physician Assistant

## 2018-01-03 ENCOUNTER — Encounter: Payer: Self-pay | Admitting: Physician Assistant

## 2018-01-03 VITALS — BP 154/88 | HR 96 | Ht 76.0 in | Wt 209.8 lb

## 2018-01-03 DIAGNOSIS — E7800 Pure hypercholesterolemia, unspecified: Secondary | ICD-10-CM

## 2018-01-03 DIAGNOSIS — I4892 Unspecified atrial flutter: Secondary | ICD-10-CM | POA: Diagnosis not present

## 2018-01-03 DIAGNOSIS — I951 Orthostatic hypotension: Secondary | ICD-10-CM | POA: Diagnosis not present

## 2018-01-03 DIAGNOSIS — I4510 Unspecified right bundle-branch block: Secondary | ICD-10-CM

## 2018-01-03 DIAGNOSIS — Z01818 Encounter for other preprocedural examination: Secondary | ICD-10-CM | POA: Diagnosis not present

## 2018-01-03 DIAGNOSIS — I471 Supraventricular tachycardia: Secondary | ICD-10-CM

## 2018-01-03 MED ORDER — VERAPAMIL HCL ER 120 MG PO TBCR: 120.0000 mg | EXTENDED_RELEASE_TABLET | Freq: Every day | ORAL | 1 refills | Status: DC

## 2018-01-03 NOTE — Progress Notes (Signed)
Cardiology Office Note    Date:  01/03/2018   ID:  Logan Aguirre, Poffenberger 06/13/1940, MRN [id number]  PCP:  Darcus Austin, MD  Cardiologist: Fransico Him, MD  No chief complaint on file.   History of Present Illness:  Logan Aguirre is a 77 y.o. male with history of normal coronary arteries, chronic RBBB, SVT, atrial flutter on Xarelto, CHA2DS2-VASc equals 4, prior TIA.    Patient having hematuria and will need prostate surgery.  Recently cleared for surgery by Dr. Radford Pax but told to only hold Xarelto for 24 hours because of prior CVA.  Patient was taking the wrong verapamil dose at 120 mg daily once he realized that it he called and it was increased to 240 mg daily.  Patient says whenever he gets up in the middle of the night to go to the bathroom he is dizzy and has to hold onto something before he can start walking.  Has not been drinking water because of his enlarged prostate.    Past Medical History:  Diagnosis Date  . Atrial flutter ([place])    in setting of URI now on Xarelto for a CHADS2VASC score of 4  . Colon polyp   . GERD (gastroesophageal reflux disease)   . Gout   . Paroxysmal supraventricular tachycardia ([place])    Dr Radford Pax  . Pure hypercholesterolemia   . Right bundle branch block    normal coronary arteries   . 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 CARDIAC CATHETERIZATION     normal coronary arteries  . LEFT HEART CATHETERIZATION WITH CORONARY ANGIOGRAM N/A 03/20/2014   Procedure: LEFT HEART CATHETERIZATION WITH CORONARY ANGIOGRAM;  Surgeon: Burnell Blanks, MD;  Location: [facility] CATH LAB;  Service: Cardiovascular;  Laterality: N/A;    Current Medications: Current Meds  Medication Sig  . allopurinol (ZYLOPRIM) 300 MG tablet take 1 tablet by mouth daily  . atorvastatin (LIPITOR) 10 MG tablet Take 1 tablet (10 mg total) by mouth daily.  Marland Kitchen docusate sodium (COLACE) 100 MG capsule Take  100 mg by mouth daily.  . finasteride (PROSCAR) 5 MG tablet Take 1 tablet by mouth daily.  . fluticasone (FLONASE) 50 MCG/ACT nasal spray Place 1 spray into both nostrils daily as needed for allergies or rhinitis.  Marland Kitchen omeprazole (PRILOSEC) 20 MG capsule Take 1 capsule (20 mg total) by mouth daily.  . tamsulosin (FLOMAX) 0.4 MG CAPS capsule Take 0.4 mg by mouth 2 (two) times daily.  Alveda Reasons 20 MG TABS tablet TAKE 1 TABLET BY MOUTH EVERY DAY WITH SUPPER  . [DISCONTINUED] verapamil (CALAN-SR) 240 MG CR tablet Take 1 tablet (240 mg total) by mouth at bedtime.     Allergies:   Amoxicillin; Ranitidine hcl; Shellfish allergy; and Zantac [ranitidine hcl]   Social History   Socioeconomic History  . Marital status: Married    Spouse name: Not on file  . Number of children: Not on file  . Years of education: Not on file  . Highest education level: Not on file  Occupational History  . Not on file  Social Needs  . Financial resource strain: Not on file  . Food insecurity:    Worry: Not on file    Inability: Not on file  . Transportation needs:    Medical: Not on file    Non-medical: Not on file  Tobacco Use  . Smoking status: Current Some  Day Smoker    Types: Cigars  . Smokeless tobacco: Never Used  Substance and Sexual Activity  . Alcohol use: Yes    Alcohol/week: 3.0 standard drinks    Types: 3 Glasses of wine per week  . Drug use: No  . Sexual activity: Not on file  Lifestyle  . Physical activity:    Days per week: Not on file    Minutes per session: Not on file  . Stress: Not on file  Relationships  . Social connections:    Talks on phone: Not on file    Gets together: Not on file    Attends religious service: Not on file    Active member of club or organization: Not on file    Attends meetings of clubs or organizations: Not on file    Relationship status: Not on file  Other Topics Concern  . Not on file  Social History Narrative   Current smoker cigar (occasional)    Alcohol -yes wine 3x a week   No caffeine    No recreational drug use   Diet yes- diet no salt ,no caffeine   Occupation Corporate investment banker status   Children 2     Family History:  The patient's family history includes COPD in his father; CVA in his brother; Colon cancer in his father; Multiple myeloma in his mother; Suicidality in his brother.   ROS:   Please see the history of present illness.    Review of Systems  Constitution: Negative.  HENT: Negative.   Cardiovascular: Negative.   Respiratory: Negative.   Endocrine: Negative.   Hematologic/Lymphatic: Negative.   Musculoskeletal: Negative.   Gastrointestinal: Negative.   Genitourinary: Positive for frequency and hematuria.  Neurological: Negative.    All other systems reviewed and are negative.   PHYSICAL EXAM:   VS:  BP (!) 154/88   Pulse 96   Ht _0  (1.93 m)   Wt 209 lb 12.8 oz (95.2 kg)   SpO2 99%   BMI 25.54 kg/m   Physical Exam  GEN: Well nourished, well developed, in no acute distress  Neck: no JVD, carotid bruits, or masses Cardiac:RRR; no murmurs, rubs, or gallops  Respiratory:  clear to auscultation bilaterally, normal work of breathing GI: soft, nontender, nondistended, + BS Ext: without cyanosis, clubbing, or edema, Good distal pulses bilaterally Neuro:  Alert and Oriented x 3 Psych: euthymic mood, full affect  Wt Readings from Last 3 Encounters:  01/03/18 209 lb 12.8 oz (95.2 kg)  12/05/17 208 lb (94.3 kg)  10/15/17 209 lb (94.8 kg)      Studies/Labs Reviewed:   EKG:  EKG is not ordered today.    Recent Labs: 05/30/2017: BUN 19; Creatinine, Ser 1.18; Hemoglobin 14.7; Platelets 248; Potassium 4.3; Sodium 142   Lipid Panel    Component Value Date/Time   CHOL  11/07/2007 2355    173        ATP III CLASSIFICATION:  <200     mg/dL   Desirable  200-239  mg/dL   Borderline High  >=240    mg/dL   High   TRIG 144 11/07/2007 2355   HDL 27 (L) 11/07/2007 2355   CHOLHDL 6.4  11/07/2007 2355   VLDL 29 11/07/2007 2355   LDLCALC (H) 11/07/2007 2355    117        Total Cholesterol/HDL:CHD Risk Coronary Heart Disease Risk Table  Men   Women  1/2 Average Risk   3.4   3.3    Additional studies/ records that were reviewed today include:  2D echo 07/14/2016 Study Conclusions   - Left ventricle: The cavity size was normal. There was mild   concentric hypertrophy. Systolic function was normal. The   estimated ejection fraction was in the range of 55% to 60%. Wall   motion was normal; there were no regional wall motion   abnormalities. Doppler parameters are consistent with abnormal   left ventricular relaxation (grade 1 diastolic dysfunction).   Doppler parameters are consistent with indeterminate ventricular   filling pressure. - Aortic valve: Transvalvular velocity was within the normal range.   There was no stenosis. There was no regurgitation. - Mitral valve: Transvalvular velocity was within the normal range.   There was no evidence for stenosis. There was no regurgitation. - Left atrium: The atrium was moderately dilated. - Right ventricle: The cavity size was mildly dilated. Wall   thickness was normal. Systolic function was normal. - Right atrium: The atrium was moderately dilated. - Atrial septum: No defect or patent foramen ovale was identified   by color flow Doppler. - Tricuspid valve: There was mild regurgitation. - Pulmonary arteries: Systolic pressure was within the normal   range. PA peak pressure: 29 mm Hg (S).   --------------     ASSESSMENT:    1. Atrial flutter, unspecified type ([place])   2. Paroxysmal supraventricular tachycardia ([place])   3. RBBB   4. Pure hypercholesterolemia   5. Orthostatic hypotension   6. Preoperative clearance      PLAN:  In order of problems listed above:  Atrial flutter/SVT on verapamil-was inadvertently taken 120 mg daily and then increased to 240 mg daily once he realized the  mistake.  Having dizziness when he gets up in the middle of the night.  And he is orthostatic with drop in blood pressure and increase in heart rate.  Orthostatic hypotension with significant drop in blood pressure and increase in pulse with standing.  Patient has not been drinking water because of his enlarged prostate need for surgery.  Have asked him to increase his water intake to at least 8 glasses a day.  Decrease verapamil to 120 mg daily.  Follow-up with Dr. Radford Pax or myself in 2 weeks for further adjustment. Right bundle branch block  Preoperative clearance for prostate surgery.  Dr. Radford Pax already cleared him for surgery.  Can hold Xarelto for 24 hours but if it needs to be held longer he will need a Lovenox bridge which can be arranged through our pharmacy.  He will call us and let us know when his surgical date is and how long he needs to hold the Xarelto. According to the Revised Cardiac Risk Index (RCRI), his Perioperative Risk of Major Cardiac Event is (%): 0.9  His Functional Capacity in METs is: 7.59 according to the Duke Activity Status Index (DASI).    Right bundle branch block chronic   Hypercholesterolemia on Lipitor    Medication Adjustments/Labs and Tests Ordered: Current medicines are reviewed at length with the patient today.  Concerns regarding medicines are outlined above.  Medication changes, Labs and Tests ordered today are listed in the Patient Instructions below. Patient Instructions  Medication Instructions:  Your physician has recommended you make the following change in your medication:    1. DECREASE: verapamil to 120 mg daily  Labwork: None ordered  Testing/Procedures: None ordered  Follow-Up: Your physician recommends that  you follow-up in 2 weeks---Appointment scheduled with Dr. Radford Pax on 01/23/18 at 2:00 PM   Any Other Special Instructions Will Be Listed Below (If Applicable).  Call our office once you have a surgery date and let us know  how long they want you to hold your Xarelto. If they require you to hold it greater than 24 hours you will need a lovenox bridge.    If you need a refill on your cardiac medications before your next appointment, please call your pharmacy.     Signed, Ermalinda Barrios, PA-C  01/03/2018 2:52 PM    [facility] [place], [place], [place]  [address] Phone: [phone]; Fax: [phone]

## 2018-01-03 NOTE — Patient Instructions (Addendum)
Medication Instructions:  Your physician has recommended you make the following change in your medication:    1. DECREASE: verapamil to 120 mg daily  Labwork: None ordered  Testing/Procedures: None ordered  Follow-Up: Your physician recommends that you follow-up in 2 weeks---Appointment scheduled with Dr. Radford Pax on 01/23/18 at 2:00 PM   Any Other Special Instructions Will Be Listed Below (If Applicable).  Call our office once you have a surgery date and let us know how long they want you to hold your Xarelto. If they require you to hold it greater than 24 hours you will need a lovenox bridge.    If you need a refill on your cardiac medications before your next appointment, please call your pharmacy.

## 2018-01-04 DIAGNOSIS — R2231 Localized swelling, mass and lump, right upper limb: Secondary | ICD-10-CM | POA: Diagnosis not present

## 2018-01-19 ENCOUNTER — Other Ambulatory Visit: Payer: Self-pay

## 2018-01-19 ENCOUNTER — Ambulatory Visit (INDEPENDENT_AMBULATORY_CARE_PROVIDER_SITE_OTHER): Payer: Medicare Other | Admitting: Urology

## 2018-01-19 ENCOUNTER — Encounter: Payer: Self-pay | Admitting: Urology

## 2018-01-19 VITALS — BP 113/69 | HR 66 | Ht 76.0 in | Wt 213.4 lb

## 2018-01-19 DIAGNOSIS — N401 Enlarged prostate with lower urinary tract symptoms: Secondary | ICD-10-CM

## 2018-01-19 DIAGNOSIS — R3914 Feeling of incomplete bladder emptying: Secondary | ICD-10-CM

## 2018-01-19 NOTE — Progress Notes (Signed)
01/19/2018 4:08 PM   Logan Aguirre [birth year] [id number]  Referring provider: Darcus Austin, MD [address] [place], [place] [address]  CC: BPH/LUTS, 150cc prostate  HPI: I had the pleasure of seeing [name] in urology clinic today for opinion on enlarged prostate with BPH/LUTS.  He was previously seen by Dr. Diona Aguirre at [facility] urology in [place] for possible UroLift, however he was found to have a 150cc gland on transrectal ultrasound.  He was referred to me to discuss HOLEP.  His urinary symptoms are primarily weak stream, frequency, and nocturia.  He also has multiple episodes of retention previously requiring catheter placement.  He is currently on maximal medical therapy with Flomax and finasteride.  There are no aggravating or alleviating factors.  The duration is over a decade.  He is on Xarelto for history of TIA and atrial flutter.   PMH: Past Medical History:  Diagnosis Date  . Atrial flutter ([place])    in setting of URI now on Xarelto for a CHADS2VASC score of 4  . Colon polyp   . GERD (gastroesophageal reflux disease)   . Gout   . Paroxysmal supraventricular tachycardia ([place])    Dr Logan Aguirre  . Pure hypercholesterolemia   . Right bundle branch block    normal coronary arteries   . 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 CARDIAC CATHETERIZATION     normal coronary arteries  . LEFT HEART CATHETERIZATION WITH CORONARY ANGIOGRAM N/A 03/20/2014   Procedure: LEFT HEART CATHETERIZATION WITH CORONARY ANGIOGRAM;  Surgeon: Burnell Blanks, MD;  Location: [facility] CATH LAB;  Service: Cardiovascular;  Laterality: N/A;     Allergies:  Allergies  Allergen Reactions  . Amoxicillin Hives    rash  . Ranitidine Hcl Hives  . Shellfish Allergy Hives    Hives   . Zantac [Ranitidine Hcl]     hives    Family History: Family History  Problem Relation Age  of Onset  . Multiple myeloma Mother   . COPD Father   . Colon cancer Father   . Suicidality Brother   . CVA Brother     Social History:  reports that he has been smoking cigars. He has never used smokeless tobacco. He reports that he drinks about 3.0 standard drinks of alcohol per week. He reports that he does not use drugs.  ROS: Please see flowsheet from today's date for complete review of systems.  Physical Exam: BP 113/69   Pulse 66   Ht _0  (1.93 m)   Wt 213 lb 6.4 oz (96.8 kg)   BMI 25.98 kg/m    Constitutional:  Alert and oriented, No acute distress. Cardiovascular: No clubbing, cyanosis, or edema. Respiratory: Normal respiratory effort, no increased work of breathing. GI: Abdomen is soft, nontender, nondistended, no abdominal masses GU: No CVA tenderness Lymph: No cervical or inguinal lymphadenopathy. Skin: No rashes, bruises or suspicious lesions. Neurologic: Grossly intact, no focal deficits, moving all 4 extremities. Psychiatric: Normal mood and affect.  Laboratory Data: Creatinine 1.18 Hematocrit 42  Pertinent Imaging: Prostate measures 150 cc on transrectal ultrasound  Assessment & Plan:   In summary, Logan Aguirre is a 77 year old male on Xarelto for history of TIA who presents to discuss his severe urinary symptoms.  He has a 150 g prostate on ultrasound, and he desires surgical intervention.  He also has significant dizziness that is  likely secondary to his high-dose Flomax.  We had a long conversation today about HOLEP including the risks of bleeding, infection, temporary urge incontinence, and possible persistent symptoms secondary to weak detrusor.  He will see cardiology to see how long he can safely hold Xarelto, ideally we would hold this for 5 days, however I am willing to perform the procedure if we can hold it for 36 to 48 hours.  Schedule HOLEP  [place], [facility] [address], [place], [place] [address] [phone]

## 2018-01-23 ENCOUNTER — Encounter: Payer: Self-pay | Admitting: Cardiology

## 2018-01-23 ENCOUNTER — Ambulatory Visit (INDEPENDENT_AMBULATORY_CARE_PROVIDER_SITE_OTHER): Payer: Medicare Other | Admitting: Cardiology

## 2018-01-23 VITALS — BP 110/70 | HR 126 | Ht 76.0 in | Wt 212.4 lb

## 2018-01-23 DIAGNOSIS — I951 Orthostatic hypotension: Secondary | ICD-10-CM

## 2018-01-23 DIAGNOSIS — I4510 Unspecified right bundle-branch block: Secondary | ICD-10-CM

## 2018-01-23 DIAGNOSIS — I4892 Unspecified atrial flutter: Secondary | ICD-10-CM

## 2018-01-23 MED ORDER — VERAPAMIL HCL ER 120 MG PO TBCR: 120.0000 mg | EXTENDED_RELEASE_TABLET | Freq: Two times a day (BID) | ORAL | 3 refills | Status: DC

## 2018-01-23 NOTE — Patient Instructions (Signed)
Medication Instructions:  Increase Verapamil to 120 mg, twice a day.  Follow-Up: You have been referred to: The Atrial fibrillation clinic  Your physician recommends that you schedule a follow-up appointment in: 1 week with Ermalinda Barrios, or PA   If you need a refill on your cardiac medications before your next appointment, please call your pharmacy.

## 2018-01-23 NOTE — Progress Notes (Signed)
Cardiology Office Note:    Date:  01/23/2018   ID:  Aguirre, Logan 01/24/1941, MRN [id number]  PCP:  Darcus Austin, MD  Cardiologist:  Fransico Him, MD    Referring MD: Darcus Austin, MD   Chief Complaint  Patient presents with  . Atrial Flutter    History of Present Illness:    Logan Aguirre is a 77 y.o. male with a hx of normal coronary arteries, chronic RBBB, SVT, atrial flutter on Xarelto, CHA2DS2-VASc equals 4, prior TIA.  He was seen by my PA Amie Portland earlier this month and was complaining of problems with orthostasis.  He had not been drinking much fluids because of his enlarged prostate.  His verapamil was decreased to 120 mg daily he was encouraged to increase his fluid intake.  He is now back for follow-up after decreasing the verapamil dose.He is here today for followup and is doing well.  He denies any chest pain or pressure, SOB, DOE, PND, orthopnea, LE edema, dizziness, palpitations or syncope. He is compliant with his meds and is tolerating meds with no SE.    Past Medical History:  Diagnosis Date  . Atrial flutter ([place])    in setting of URI now on Xarelto for a CHADS2VASC score of 4  . Colon polyp   . GERD (gastroesophageal reflux disease)   . Gout   . Paroxysmal supraventricular tachycardia ([place])    Dr Radford Pax  . Pure hypercholesterolemia   . Right bundle branch block    normal coronary arteries   . 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 CARDIAC CATHETERIZATION     normal coronary arteries  . LEFT HEART CATHETERIZATION WITH CORONARY ANGIOGRAM N/A 03/20/2014   Procedure: LEFT HEART CATHETERIZATION WITH CORONARY ANGIOGRAM;  Surgeon: Burnell Blanks, MD;  Location: [facility] CATH LAB;  Service: Cardiovascular;  Laterality: N/A;    Current Medications: Current Meds  Medication Sig  . allopurinol (ZYLOPRIM) 300 MG tablet take 1 tablet by mouth daily  . atorvastatin (LIPITOR)  10 MG tablet Take 1 tablet (10 mg total) by mouth daily.  Marland Kitchen docusate sodium (COLACE) 100 MG capsule Take 100 mg by mouth daily.  . finasteride (PROSCAR) 5 MG tablet Take 1 tablet by mouth daily.  . fluticasone (FLONASE) 50 MCG/ACT nasal spray Place 1 spray into both nostrils daily as needed for allergies or rhinitis.  Marland Kitchen omeprazole (PRILOSEC) 20 MG capsule Take 1 capsule (20 mg total) by mouth daily.  . tamsulosin (FLOMAX) 0.4 MG CAPS capsule Take 0.4 mg by mouth 2 (two) times daily.  . verapamil (CALAN-SR) 120 MG CR tablet Take 1 tablet (120 mg total) by mouth at bedtime.  Alveda Reasons 20 MG TABS tablet TAKE 1 TABLET BY MOUTH EVERY DAY WITH SUPPER     Allergies:   Amoxicillin; Ranitidine hcl; Shellfish allergy; and Zantac [ranitidine hcl]   Social History   Socioeconomic History  . Marital status: Married    Spouse name: Not on file  . Number of children: Not on file  . Years of education: Not on file  . Highest education level: Not on file  Occupational History  . Not on file  Social Needs  . Financial resource strain: Not on file  . Food insecurity:    Worry: Not on file    Inability: Not on file  . Transportation needs:    Medical: Not on file  Non-medical: Not on file  Tobacco Use  . Smoking status: Current Some Day Smoker    Types: Cigars  . Smokeless tobacco: Never Used  Substance and Sexual Activity  . Alcohol use: Yes    Alcohol/week: 3.0 standard drinks    Types: 3 Glasses of wine per week  . Drug use: No  . Sexual activity: Not on file  Lifestyle  . Physical activity:    Days per week: Not on file    Minutes per session: Not on file  . Stress: Not on file  Relationships  . Social connections:    Talks on phone: Not on file    Gets together: Not on file    Attends religious service: Not on file    Active member of club or organization: Not on file    Attends meetings of clubs or organizations: Not on file    Relationship status: Not on file  Other Topics  Concern  . Not on file  Social History Narrative   Current smoker cigar (occasional)   Alcohol -yes wine 3x a week   No caffeine    No recreational drug use   Diet yes- diet no salt ,no caffeine   Occupation Corporate investment banker status   Children 2     Family History: The patient's family history includes COPD in his father; CVA in his brother; Colon cancer in his father; Multiple myeloma in his mother; Suicidality in his brother.  ROS:   Please see the history of present illness.    ROS  All other systems reviewed and negative.   EKGs/Labs/Other Studies Reviewed:    The following studies were reviewed today: none  EKG:  EKG is  ordered today and showed atrial flutter with variable AV block at 96 bpm.  Right bundle branch block.  Recent Labs: 05/30/2017: BUN 19; Creatinine, Ser 1.18; Hemoglobin 14.7; Platelets 248; Potassium 4.3; Sodium 142   Recent Lipid Panel    Component Value Date/Time   CHOL  11/07/2007 2355    173        ATP III CLASSIFICATION:  <200     mg/dL   Desirable  200-239  mg/dL   Borderline High  >=240    mg/dL   High   TRIG 144 11/07/2007 2355   HDL 27 (L) 11/07/2007 2355   CHOLHDL 6.4 11/07/2007 2355   VLDL 29 11/07/2007 2355   LDLCALC (H) 11/07/2007 2355    117        Total Cholesterol/HDL:CHD Risk Coronary Heart Disease Risk Table                     Men   Women  1/2 Average Risk   3.4   3.3    Physical Exam:    VS:  BP 110/70   Pulse (!) 126   Ht 6' 4"  (1.93 m)   Wt 212 lb 6.4 oz (96.3 kg)   SpO2 97%   BMI 25.85 kg/m     Wt Readings from Last 3 Encounters:  01/23/18 212 lb 6.4 oz (96.3 kg)  01/19/18 213 lb 6.4 oz (96.8 kg)  01/03/18 209 lb 12.8 oz (95.2 kg)     GEN:  Well nourished, well developed in no acute distress HEENT: Normal NECK: No JVD; No carotid bruits LYMPHATICS: No lymphadenopathy CARDIAC: regular but tachycardic, no murmurs, rubs, gallops RESPIRATORY:  Clear to auscultation without rales, wheezing or  rhonchi  ABDOMEN: Soft, non-tender, non-distended MUSCULOSKELETAL:  No  edema; No deformity  SKIN: Warm and dry NEUROLOGIC:  Alert and oriented x 3 PSYCHIATRIC:  Normal affect   ASSESSMENT:    1. Orthostatic hypotension   2. Atrial flutter, unspecified type ([place])   3. RBBB    PLAN:    In order of problems listed above:  1. Orthostatic hypotension -this was secondary to recent decreased p.o. intake due to enlarged prostate.  His dizziness has since resolved after decreasing the dose of Cardizem.  2.  Paroxysmal atrial flutter  -he is back in atrial flutter with borderline controlled ventricular response today. He will continue on Xarelto 20 mg daily.  Hemoglobin was 14.7 on 05/30/2017.  Since he had problems on a higher dose of verapamil 240 mg daily with orthostatic hypotension I recommended that we keep him on verapamil 120 mg daily but increase it to twice daily and hopefully with a lower dose twice a day it will help keep him rate controlled without the side effects of orthostatic hypotension.  Unfortunately due to his prostate issues he is having to take 2 Flomax a day which is likely contributing to orthostasis.  He is scheduled to have prostate surgery done in the near future and given that he is now back in atrial flutter with a history of TIA in the past I think he is too high risk to hold Xarelto without a bridge.  Therefore I have recommended that he be seen in our anticoagulation clinic for further instructions on bridging with Xarelto prior to and after his surgery for prostate.  He is going to let me know when his surgery date is and then we will set him up in our anticoagulation clinic.  In the meantime I am to refer him to A. fib clinic for further evaluation since he is having recurrent atrial flutter to discuss possible addition of antiarrhythmic drug therapy.  Given that he is can have to come off anticoagulation soon would recommend rate control for now and once he is through  his prostate surgery and back on anticoagulation for 4 weeks then we can consider addition of antiarrhythmic drug therapy if A. fib clinic in agreement.  3.  RBBB -noted as far back as November 2014 with nuclear stress test in 2015 showing no ischemia.   Medication Adjustments/Labs and Tests Ordered: Current medicines are reviewed at length with the patient today.  Concerns regarding medicines are outlined above.  No orders of the defined types were placed in this encounter.  No orders of the defined types were placed in this encounter.   Signed, Fransico Him, MD  01/23/2018 2:19 PM    [facility]

## 2018-01-23 NOTE — Addendum Note (Signed)
Addended by: Sarina Ill on: 01/23/2018 03:28 PM   Modules accepted: Orders

## 2018-01-26 ENCOUNTER — Telehealth: Payer: Self-pay

## 2018-01-26 NOTE — Telephone Encounter (Signed)
Patient notified that surgery HOLEP has been scheduled for 02-07-18 w/Dr. Diamantina Providence. Pre-op apt is on 01-31-18@ 1:30pm. Patient was instructed that we would need to get Clearance from Estella Husk, PA-C to stop Xarelto 3 days prior to surgery. Per Estella Husk note patient will need a Lovenox bridge. Patient was notified of this and clearance was faxed to her office. Patient has an apt with her on 02-01-18 and will discuss at this visit.

## 2018-01-27 ENCOUNTER — Encounter (HOSPITAL_COMMUNITY): Payer: Self-pay | Admitting: Nurse Practitioner

## 2018-01-27 ENCOUNTER — Ambulatory Visit (HOSPITAL_COMMUNITY)
Admission: RE | Admit: 2018-01-27 | Discharge: 2018-01-27 | Disposition: A | Discharge status: Home / Self Care | Payer: Medicare Other | Source: Ambulatory Visit | Attending: Nurse Practitioner | Admitting: Nurse Practitioner

## 2018-01-27 ENCOUNTER — Other Ambulatory Visit: Payer: Self-pay

## 2018-01-27 VITALS — BP 142/74 | HR 82 | Ht 76.0 in | Wt 213.0 lb

## 2018-01-27 DIAGNOSIS — Z880 Allergy status to penicillin: Secondary | ICD-10-CM | POA: Insufficient documentation

## 2018-01-27 DIAGNOSIS — Z8673 Personal history of transient ischemic attack (TIA), and cerebral infarction without residual deficits: Secondary | ICD-10-CM | POA: Diagnosis not present

## 2018-01-27 DIAGNOSIS — K219 Gastro-esophageal reflux disease without esophagitis: Secondary | ICD-10-CM | POA: Diagnosis not present

## 2018-01-27 DIAGNOSIS — I4892 Unspecified atrial flutter: Secondary | ICD-10-CM | POA: Diagnosis not present

## 2018-01-27 DIAGNOSIS — Z7901 Long term (current) use of anticoagulants: Secondary | ICD-10-CM | POA: Diagnosis not present

## 2018-01-27 DIAGNOSIS — I483 Typical atrial flutter: Secondary | ICD-10-CM | POA: Insufficient documentation

## 2018-01-27 DIAGNOSIS — Z91013 Allergy to seafood: Secondary | ICD-10-CM | POA: Diagnosis not present

## 2018-01-27 DIAGNOSIS — Z79899 Other long term (current) drug therapy: Secondary | ICD-10-CM | POA: Insufficient documentation

## 2018-01-27 NOTE — Progress Notes (Signed)
Primary Care Physician: Darcus Austin, MD Referring Physician: Creed Copper. Logan Aguirre is a 77 y.o. male with a h/o atrial flutter, first diagnosed in February 2018 when following up with his PCP for cold symptoms. He was started on rate control with Cardizem and it has has been quiet but has recently  returned. He is pending prostate surgery surgery 10/8. Dr Radford Pax gave surgery clearance and he will need bridging thru the coumadin clinic as he had a prior TIA.   Dr. Radford Pax wanted me to discuss antiarrythmic therapy for possible start after surgery but review of his EKG's show typical atrial flutter so he may be an ablation candidate and then hopefully may not  need cardizem or long term  anticoagulation. He is not symptomatic with flutter. He is rate controlled.  Today, he denies symptoms of palpitations, chest pain, shortness of breath, orthopnea, PND, lower extremity edema, dizziness, presyncope, syncope, or neurologic sequela. The patient is tolerating medications without difficulties and is otherwise without complaint today.   Past Medical History:  Diagnosis Date  . Atrial flutter ([place])    in setting of URI now on Xarelto for a CHADS2VASC score of 4  . Colon polyp   . GERD (gastroesophageal reflux disease)   . Gout   . Paroxysmal supraventricular tachycardia ([place])    Dr Radford Pax  . Pure hypercholesterolemia   . Right bundle branch block    normal coronary arteries   . 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 CARDIAC CATHETERIZATION     normal coronary arteries  . LEFT HEART CATHETERIZATION WITH CORONARY ANGIOGRAM N/A 03/20/2014   Procedure: LEFT HEART CATHETERIZATION WITH CORONARY ANGIOGRAM;  Surgeon: Burnell Blanks, MD;  Location: [facility] CATH LAB;  Service: Cardiovascular;  Laterality: N/A;    Current Outpatient Medications  Medication Sig Dispense Refill  . allopurinol (ZYLOPRIM) 300 MG tablet  Take 300 mg by mouth daily.   5  . atorvastatin (LIPITOR) 10 MG tablet Take 1 tablet (10 mg total) by mouth daily. 90 tablet 3  . docusate sodium (COLACE) 100 MG capsule Take 100 mg by mouth daily.    . finasteride (PROSCAR) 5 MG tablet Take 5 mg by mouth daily.   3  . fluticasone (FLONASE) 50 MCG/ACT nasal spray Place 1 spray into both nostrils daily as needed for allergies or rhinitis.    Marland Kitchen omeprazole (PRILOSEC) 20 MG capsule Take 1 capsule (20 mg total) by mouth daily. 90 capsule 3  . tamsulosin (FLOMAX) 0.4 MG CAPS capsule Take 0.4 mg by mouth 2 (two) times daily.    . verapamil (CALAN-SR) 120 MG CR tablet Take 1 tablet (120 mg total) by mouth 2 (two) times daily. 180 tablet 3  . XARELTO 20 MG TABS tablet TAKE 1 TABLET BY MOUTH EVERY DAY WITH SUPPER (Patient taking differently: Take 20 mg by mouth daily with supper. ) 30 tablet 11   No current facility-administered medications for this encounter.     Allergies  Allergen Reactions  . Amoxicillin Hives    rash  . Shellfish Allergy Hives    Mainly Shrimp   . Zantac [Ranitidine Hcl]     hives    Social History   Socioeconomic History  . Marital status: Married    Spouse name: Not on file  . Number of children: Not on file  . Years of education: Not on file  .  Highest education level: Not on file  Occupational History  . Not on file  Social Needs  . Financial resource strain: Not on file  . Food insecurity:    Worry: Not on file    Inability: Not on file  . Transportation needs:    Medical: Not on file    Non-medical: Not on file  Tobacco Use  . Smoking status: Former Smoker    Types: Cigars  . Smokeless tobacco: Never Used  Substance and Sexual Activity  . Alcohol use: Yes    Alcohol/week: 3.0 standard drinks    Types: 3 Glasses of wine per week  . Drug use: No  . Sexual activity: Not on file  Lifestyle  . Physical activity:    Days per week: Not on file    Minutes per session: Not on file  . Stress: Not on  file  Relationships  . Social connections:    Talks on phone: Not on file    Gets together: Not on file    Attends religious service: Not on file    Active member of club or organization: Not on file    Attends meetings of clubs or organizations: Not on file    Relationship status: Not on file  . Intimate partner violence:    Fear of current or ex partner: Not on file    Emotionally abused: Not on file    Physically abused: Not on file    Forced sexual activity: Not on file  Other Topics Concern  . Not on file  Social History Narrative   Current smoker cigar (occasional)   Alcohol -yes wine 3x a week   No caffeine    No recreational drug use   Diet yes- diet no salt ,no caffeine   Occupation principal,teacher   Martial status   Children 2    Family History  Problem Relation Age of Onset  . Multiple myeloma Mother   . COPD Father   . Colon cancer Father   . Suicidality Brother   . CVA Brother     ROS- All systems are reviewed and negative except as per the HPI above  Physical Exam: Vitals:   01/27/18 0926  BP: (!) 142/74  Pulse: 82  Weight: 96.6 kg  Height: _0  (1.93 m)   Wt Readings from Last 3 Encounters:  01/27/18 96.6 kg  01/23/18 96.3 kg  01/19/18 96.8 kg    Labs: Lab Results  Component Value Date   NA 142 05/30/2017   K 4.3 05/30/2017   CL 104 05/30/2017   CO2 21 05/30/2017   GLUCOSE 131 (H) 05/30/2017   BUN 19 05/30/2017   CREATININE 1.18 05/30/2017   CALCIUM 8.6 05/30/2017   MG 2.3 06/29/2016   Lab Results  Component Value Date   INR 1.0 03/18/2014   Lab Results  Component Value Date   CHOL  11/07/2007    173        ATP III CLASSIFICATION:  <200     mg/dL   Desirable  200-239  mg/dL   Borderline High  >=240    mg/dL   High   HDL 27 (L) 11/07/2007   LDLCALC (H) 11/07/2007    117        Total Cholesterol/HDL:CHD Risk Coronary Heart Disease Risk Table                     Men   Women  1/2 Average Risk   3.4  3.3   TRIG 144  11/07/2007     GEN- The patient is well appearing, alert and oriented x 3 today.   Head- normocephalic, atraumatic Eyes-  Sclera clear, conjunctiva pink Ears- hearing intact Oropharynx- clear Neck- supple, no JVP Lymph- no cervical lymphadenopathy Lungs- Clear to ausculation bilaterally, normal work of breathing Heart- Regular rate and rhythm, no murmurs, rubs or gallops, PMI not laterally displaced GI- soft, NT, ND, + BS Extremities- no clubbing, cyanosis, or edema MS- no significant deformity or atrophy Skin- no rash or lesion Psych- euthymic mood, full affect Neuro- strength and sensation are intact  EKG- review of ekg's starting 06/28/16 show typical atrial flutter, no afib noted Today with typical atrial flutter at 82 bpm, RBBB, qrs int 136 ms, qtc 553 ms   Assessment and Plan: 1. Typical atrial flutter  All the ekg's I reviewed appear to be typical atrial  flutter I will refer to Dr. Lovena Le for aflutter ablation  Pt is in agreement,  pending appointment later in October after prostate surgery  2. Prostate surgery 10/8 Pt has not been contacted buy the coumadin clinic for bridging  per Dr. Radford Pax, with piror TIA Will ask for appointment  He has been given surgical clearance by Dr. Heron Nay He is rate controlled    Geroge Baseman. Kellyanne Ellwanger, [facility] [address] [place], [place] [address] [phone]

## 2018-01-30 ENCOUNTER — Telehealth: Payer: Self-pay | Admitting: Pharmacist

## 2018-01-30 NOTE — Telephone Encounter (Signed)
-----   Message from Juluis Mire, RN sent at 01/27/2018 10:03 AM EDT ----- Regarding: xarelto/lovenox bridge Per Butch Penny - pt needs bridging for upcoming surgery to hold xarelto per dr turner.  Below is the surgery he is planned for on 02/07/18- Sninsky, Herbert Seta, MD Primary   [place] MORCELLATION   Please contact pt  Thanks! stacy

## 2018-01-30 NOTE — Telephone Encounter (Signed)
Will coordinate after upcoming appt with Ermalinda Barrios on 10/2.

## 2018-01-31 ENCOUNTER — Encounter
Admission: RE | Admit: 2018-01-31 | Discharge: 2018-01-31 | Disposition: A | Discharge status: Home / Self Care | Payer: Medicare Other | Source: Ambulatory Visit | Attending: Urology | Admitting: Urology

## 2018-01-31 ENCOUNTER — Other Ambulatory Visit: Payer: Self-pay

## 2018-01-31 DIAGNOSIS — N400 Enlarged prostate without lower urinary tract symptoms: Secondary | ICD-10-CM | POA: Insufficient documentation

## 2018-01-31 HISTORY — DX: Essential (primary) hypertension: I10

## 2018-01-31 HISTORY — DX: Cerebral infarction, unspecified: I63.9

## 2018-01-31 LAB — BASIC METABOLIC PANEL
Anion gap: 6 (ref 5–15)
BUN: 21 mg/dL (ref 8–23)
CO2: 26 mmol/L (ref 22–32)
CREATININE: 0.98 mg/dL (ref 0.61–1.24)
Calcium: 8.8 mg/dL — ABNORMAL LOW (ref 8.9–10.3)
Chloride: 109 mmol/L (ref 98–111)
GFR calc non Af Amer: >60 mL/min (ref >60)
Glucose, Bld: 107 mg/dL — ABNORMAL HIGH (ref 70–99)
Potassium: 3.9 mmol/L (ref 3.5–5.1)
SODIUM: 141 mmol/L (ref 135–145)

## 2018-01-31 LAB — URINALYSIS, ROUTINE W REFLEX MICROSCOPIC
BILIRUBIN URINE: NEGATIVE
Glucose, UA: NEGATIVE mg/dL
HGB URINE DIPSTICK: NEGATIVE
KETONES UR: NEGATIVE mg/dL
Leukocytes, UA: NEGATIVE
NITRITE: NEGATIVE
Protein, ur: NEGATIVE mg/dL
Specific Gravity, Urine: 1.019 (ref 1.005–1.030)
pH: 5 (ref 5.0–8.0)

## 2018-01-31 LAB — CBC
HCT: 43.7 % (ref 40.0–52.0)
HEMOGLOBIN: 15.1 g/dL (ref 13.0–18.0)
MCH: 33 pg (ref 26.0–34.0)
MCHC: 34.5 g/dL (ref 32.0–36.0)
MCV: 95.6 fL (ref 80.0–100.0)
PLATELETS: 189 10*3/uL (ref 150–440)
RBC: 4.57 MIL/uL (ref 4.40–5.90)
RDW: 15.3 % — ABNORMAL HIGH (ref 11.5–14.5)
WBC: 7.5 10*3/uL (ref 3.8–10.6)

## 2018-01-31 NOTE — Patient Instructions (Signed)
  Your procedure is scheduled on: Tuesday February 07, 2018 Report to Same Day Surgery 2nd floor medical mall [facility] Entrance-take elevator on left to 2nd floor.  Check in with surgery information desk.) To find out your arrival time please call [phone] between 1PM - 3PM on Monday February 06, 2018  Remember: Instructions that are not followed completely may result in serious medical risk, up to and including death, or upon the discretion of your surgeon and anesthesiologist your surgery may need to be rescheduled.    _x___ 1. Do not eat food (including mints, candies, chewing gum) after midnight the night before your procedure. You may drink clear liquids up to 2 hours before you are scheduled to arrive at the hospital for your procedure.  Do not drink clear liquids within 2 hours of your scheduled arrival to the hospital.  Clear liquids include  --Water or Apple juice without pulp  --Clear carbohydrate beverage such as Gatorade  --Black Coffee or Clear Tea (No milk, no creamers, do not add anything to the coffee or tea)    __x__ 2. No Alcohol for 24 hours before or after surgery.   __x__ 3. No Smoking or e-cigarettes for 24 prior to surgery.  Do not use any chewable tobacco products for at least 6 hour prior to surgery   __x__ 4. Notify your doctor if there is any change in your medical condition (cold, fever, infections).   __x__ 5. On the morning of surgery brush your teeth with toothpaste and water.  You may rinse your mouth with mouth wash if you wish.  Do not swallow any toothpaste or mouthwash.   __x__ Use antibacterial soap (Dial or similar) to shower/bathe on the day of surgery.    Do not wear jewelry, lotions, powders, deodorant, or perfumes.   Do not shave below the face/neck 48 hours prior to surgery.   Do not bring valuables to the hospital.    [facility] is not responsible for any belongings or valuables.               Contacts, dentures, hearing aids or  bridgework may not be worn into surgery.  Leave your suitcase in the car. After surgery it may be brought to your room.  For patients admitted to the hospital, discharge time is determined by your treatment team.  For patients discharged on the day of surgery, you will NOT be permitted to drive yourself home.   _x___ On the morning of surgery: Take anti-hypertensive listed below, cardiac, seizure, asthma, anti-reflux and psychiatric medicines. These include:  1. Verapamil (Calan)  2. Tamsulosin (Flomax)  3. Omeprazole (Prilosec)- take normal dose the night before your surgery and additional dose the morning of surgery  _x___ Follow recommendations from Cardiologist, Pulmonologist or PCP regarding stopping Xarelto, Aspirin, Coumadin, Plavix ,Eliquis, Effient, Pradaxa, or Pletal.  _x___ Stop Anti-inflammatories such as Advil, Aleve, Ibuprofen, Motrin, Naproxen, Naprosyn, Goodies powders or aspirin products. OK to take Tylenol and Celebrex.   _x___ Stop supplements until after surgery.  But may continue Vitamin D, Vitamin B, and multivitamin.

## 2018-02-01 ENCOUNTER — Encounter: Payer: Self-pay | Admitting: Pharmacist

## 2018-02-01 ENCOUNTER — Ambulatory Visit (INDEPENDENT_AMBULATORY_CARE_PROVIDER_SITE_OTHER): Payer: Medicare Other | Admitting: Pharmacist

## 2018-02-01 ENCOUNTER — Telehealth: Payer: Self-pay

## 2018-02-01 ENCOUNTER — Ambulatory Visit: Payer: Medicare Other | Admitting: Physician Assistant

## 2018-02-01 DIAGNOSIS — I483 Typical atrial flutter: Secondary | ICD-10-CM | POA: Diagnosis not present

## 2018-02-01 DIAGNOSIS — Z7901 Long term (current) use of anticoagulants: Secondary | ICD-10-CM | POA: Diagnosis not present

## 2018-02-01 MED ORDER — ENOXAPARIN SODIUM 100 MG/ML ~~LOC~~ SOLN: 100.0000 mg | Freq: Two times a day (BID) | SUBCUTANEOUS | 0 refills | Status: DC

## 2018-02-01 NOTE — Telephone Encounter (Signed)
   Primary Cardiologist: Fransico Him, MD  Chart reviewed as part of pre-operative protocol coverage.   The patient was just seen by Dr. Radford Pax.  Will route to her to make sure patient is ok to proceed with prostate surgery.  He saw [facility] today to arrange Lovenox.  Richardson Dopp, PA-C 02/01/2018, 3:08 PM

## 2018-02-01 NOTE — Telephone Encounter (Signed)
   [facility] Pre-operative Risk Assessment    Request for surgical clearance:  1. What type of surgery is being performed? HoLEP  2. When is this surgery scheduled?  02/07/18   3. What type of clearance is required (medical clearance vs. Pharmacy clearance to hold med vs. Both)? both  4. Are there any medications that need to be held prior to surgery and how long? xarelto 3 days   5. Practice name and name of physician performing surgery?  [facility]/  Dr Georgiann Mohs  6. What is your office phone number [phone]    7.   What is your office fax number [phone]  8.   Anesthesia type (None, local, MAC, general) ?    Logan Aguirre  Logan Aguirre 02/01/2018, 2:11 PM  _________________________________________________________________   (provider comments below)

## 2018-02-01 NOTE — Progress Notes (Signed)
Patient ID: VADA YELLEN                 DOB: 1940-11-23                      MRN: [id number]     HPI: KEEVIN PANEBIANCO is a 77 y.o. male patient of Dr. Radford Pax who presents today for peri procedural anticoagulation management. PMH significant for AFib with history of TIA.   He is scheduled to have prostate procedure and will need to hold Xarelto for 3 days prior to procedure. With history of TIA, Dr. Radford Pax felt lovenox bridge around procedure would be best.   Weight 96kg Crcl 33ml/min  Assessment/Plan: Anticoagulation: 02/03/18: Last dose of Xarelto  02/04/18: Inject Lovenox 100mg  in the fatty tissue every 12 hours, 8am and 8pm. No Xarelto.  02/05/18: Inject Lovenox in the fatty tissue every 12 hours, 8am and 8pm. No Xarelto.  02/06/18: Inject Lovenox in the fatty tissue in the morning at 8 am (No PM dose). No Xarelto.  02/07/18: Procedure Day - No Lovenox - Resume Xarelto in the evening or as directed by doctor    Thank you, Lelan Pons. Patterson Hammersmith, [facility]  02/01/2018 8:44 AM

## 2018-02-01 NOTE — Patient Instructions (Signed)
Please call [phone] if you have any questions or concerns.   02/03/18: Last dose of Xarelto  02/04/18: Inject Lovenox 100mg  in the fatty tissue every 12 hours, 8am and 8pm. No Xarelto.  02/05/18: Inject Lovenox in the fatty tissue every 12 hours, 8am and 8pm. No Xarelto.  02/06/18: Inject Lovenox in the fatty tissue in the morning at 8 am (No PM dose). No Xarelto.  02/07/18: Procedure Day - No Lovenox - Resume Xarelto in the evening or as directed by doctor

## 2018-02-02 DIAGNOSIS — Z23 Encounter for immunization: Secondary | ICD-10-CM | POA: Diagnosis not present

## 2018-02-02 LAB — URINE CULTURE: Culture: NO GROWTH

## 2018-02-02 NOTE — Telephone Encounter (Signed)
Yes he is fine to have surgery

## 2018-02-06 DIAGNOSIS — M7581 Other shoulder lesions, right shoulder: Secondary | ICD-10-CM | POA: Diagnosis not present

## 2018-02-06 MED ORDER — CIPROFLOXACIN IN D5W 400 MG/200ML IV SOLN
400.0000 mg | Freq: Two times a day (BID) | INTRAVENOUS | Status: DC
  Administered 2018-02-07: 400 mg via INTRAVENOUS

## 2018-02-07 ENCOUNTER — Encounter: Payer: Self-pay | Admitting: *Deleted

## 2018-02-07 ENCOUNTER — Encounter
Admission: RE | Disposition: A | Discharge status: Home / Self Care | Payer: Self-pay | Source: Ambulatory Visit | Attending: Urology

## 2018-02-07 ENCOUNTER — Ambulatory Visit: Payer: Medicare Other | Admitting: Certified Registered"

## 2018-02-07 ENCOUNTER — Telehealth: Payer: Self-pay | Admitting: Urology

## 2018-02-07 ENCOUNTER — Other Ambulatory Visit: Payer: Self-pay

## 2018-02-07 ENCOUNTER — Ambulatory Visit
Admission: RE | Admit: 2018-02-07 | Discharge: 2018-02-07 | Disposition: A | Discharge status: Home / Self Care | Payer: Medicare Other | Source: Ambulatory Visit | Attending: Urology | Admitting: Urology

## 2018-02-07 DIAGNOSIS — R350 Frequency of micturition: Secondary | ICD-10-CM | POA: Insufficient documentation

## 2018-02-07 DIAGNOSIS — Z79899 Other long term (current) drug therapy: Secondary | ICD-10-CM | POA: Diagnosis not present

## 2018-02-07 DIAGNOSIS — Z8673 Personal history of transient ischemic attack (TIA), and cerebral infarction without residual deficits: Secondary | ICD-10-CM | POA: Insufficient documentation

## 2018-02-07 DIAGNOSIS — N400 Enlarged prostate without lower urinary tract symptoms: Secondary | ICD-10-CM | POA: Diagnosis not present

## 2018-02-07 DIAGNOSIS — N401 Enlarged prostate with lower urinary tract symptoms: Secondary | ICD-10-CM | POA: Diagnosis not present

## 2018-02-07 DIAGNOSIS — I10 Essential (primary) hypertension: Secondary | ICD-10-CM | POA: Diagnosis not present

## 2018-02-07 DIAGNOSIS — M109 Gout, unspecified: Secondary | ICD-10-CM | POA: Insufficient documentation

## 2018-02-07 DIAGNOSIS — K219 Gastro-esophageal reflux disease without esophagitis: Secondary | ICD-10-CM | POA: Diagnosis not present

## 2018-02-07 DIAGNOSIS — E7800 Pure hypercholesterolemia, unspecified: Secondary | ICD-10-CM | POA: Diagnosis not present

## 2018-02-07 DIAGNOSIS — R3912 Poor urinary stream: Secondary | ICD-10-CM | POA: Insufficient documentation

## 2018-02-07 DIAGNOSIS — R351 Nocturia: Secondary | ICD-10-CM | POA: Diagnosis not present

## 2018-02-07 HISTORY — PX: HOLEP-LASER ENUCLEATION OF THE PROSTATE WITH MORCELLATION: SHX6641

## 2018-02-07 HISTORY — DX: Unspecified osteoarthritis, unspecified site: M19.90

## 2018-02-07 HISTORY — DX: Benign prostatic hyperplasia without lower urinary tract symptoms: N40.0

## 2018-02-07 SURGERY — ENUCLEATION, PROSTATE, USING LASER, WITH MORCELLATION
Anesthesia: General

## 2018-02-07 MED ORDER — FENTANYL CITRATE (PF) 100 MCG/2ML IJ SOLN
INTRAMUSCULAR | Status: AC
  Filled 2018-02-07: qty 2

## 2018-02-07 MED ORDER — METOPROLOL TARTRATE 5 MG/5ML IV SOLN
INTRAVENOUS | Status: DC | PRN
  Administered 2018-02-07 (×3): 2.5 mg via INTRAVENOUS

## 2018-02-07 MED ORDER — OXYCODONE HCL 5 MG/5ML PO SOLN: 5.0000 mg | Freq: Once | ORAL | Status: DC | PRN

## 2018-02-07 MED ORDER — ROCURONIUM BROMIDE 50 MG/5ML IV SOLN
INTRAVENOUS | Status: AC
  Filled 2018-02-07: qty 1

## 2018-02-07 MED ORDER — SODIUM CHLORIDE 0.9 % IV SOLN
INTRAVENOUS | Status: DC | PRN
  Administered 2018-02-07: 25 ug/min via INTRAVENOUS

## 2018-02-07 MED ORDER — DEXAMETHASONE SODIUM PHOSPHATE 10 MG/ML IJ SOLN
INTRAMUSCULAR | Status: DC | PRN
  Administered 2018-02-07: 5 mg via INTRAVENOUS

## 2018-02-07 MED ORDER — CIPROFLOXACIN IN D5W 400 MG/200ML IV SOLN
INTRAVENOUS | Status: AC
  Filled 2018-02-07: qty 200

## 2018-02-07 MED ORDER — PROPOFOL 10 MG/ML IV BOLUS
INTRAVENOUS | Status: DC | PRN
  Administered 2018-02-07: 50 mg via INTRAVENOUS
  Administered 2018-02-07: 150 mg via INTRAVENOUS

## 2018-02-07 MED ORDER — HYDROCODONE-ACETAMINOPHEN 5-325 MG PO TABS: 1.0000 | ORAL_TABLET | ORAL | 0 refills | Status: AC | PRN

## 2018-02-07 MED ORDER — SUGAMMADEX SODIUM 200 MG/2ML IV SOLN
INTRAVENOUS | Status: DC | PRN
  Administered 2018-02-07: 200 mg via INTRAVENOUS

## 2018-02-07 MED ORDER — SUCCINYLCHOLINE CHLORIDE 20 MG/ML IJ SOLN
INTRAMUSCULAR | Status: AC
  Filled 2018-02-07: qty 1

## 2018-02-07 MED ORDER — METOPROLOL TARTRATE 5 MG/5ML IV SOLN
INTRAVENOUS | Status: AC
  Filled 2018-02-07: qty 5

## 2018-02-07 MED ORDER — ROCURONIUM BROMIDE 100 MG/10ML IV SOLN
INTRAVENOUS | Status: DC | PRN
  Administered 2018-02-07: 50 mg via INTRAVENOUS

## 2018-02-07 MED ORDER — LIDOCAINE HCL (CARDIAC) PF 100 MG/5ML IV SOSY
PREFILLED_SYRINGE | INTRAVENOUS | Status: DC | PRN
  Administered 2018-02-07: 100 mg via INTRAVENOUS

## 2018-02-07 MED ORDER — OXYCODONE HCL 5 MG PO TABS: 5.0000 mg | ORAL_TABLET | Freq: Once | ORAL | Status: DC | PRN

## 2018-02-07 MED ORDER — PROPOFOL 10 MG/ML IV BOLUS
INTRAVENOUS | Status: AC
  Filled 2018-02-07: qty 40

## 2018-02-07 MED ORDER — ACETAMINOPHEN 10 MG/ML IV SOLN
INTRAVENOUS | Status: AC
  Filled 2018-02-07: qty 100

## 2018-02-07 MED ORDER — LIDOCAINE HCL (PF) 2 % IJ SOLN
INTRAMUSCULAR | Status: AC
  Filled 2018-02-07: qty 10

## 2018-02-07 MED ORDER — PROMETHAZINE HCL 25 MG/ML IJ SOLN: 6.2500 mg | INTRAMUSCULAR | Status: DC | PRN

## 2018-02-07 MED ORDER — PHENYLEPHRINE HCL 10 MG/ML IJ SOLN
INTRAMUSCULAR | Status: AC
  Filled 2018-02-07: qty 1

## 2018-02-07 MED ORDER — FENTANYL CITRATE (PF) 100 MCG/2ML IJ SOLN
INTRAMUSCULAR | Status: DC | PRN
  Administered 2018-02-07 (×2): 25 ug via INTRAVENOUS
  Administered 2018-02-07: 100 ug via INTRAVENOUS
  Administered 2018-02-07: 25 ug via INTRAVENOUS
  Administered 2018-02-07: 50 ug via INTRAVENOUS
  Administered 2018-02-07 (×2): 25 ug via INTRAVENOUS

## 2018-02-07 MED ORDER — DEXAMETHASONE SODIUM PHOSPHATE 10 MG/ML IJ SOLN
INTRAMUSCULAR | Status: AC
  Filled 2018-02-07: qty 1

## 2018-02-07 MED ORDER — LACTATED RINGERS IV SOLN
INTRAVENOUS | Status: DC
  Administered 2018-02-07: 75 mL/h via INTRAVENOUS
  Administered 2018-02-07: 10:00:00 via INTRAVENOUS

## 2018-02-07 MED ORDER — MEPERIDINE HCL 50 MG/ML IJ SOLN: 6.2500 mg | INTRAMUSCULAR | Status: DC | PRN

## 2018-02-07 MED ORDER — EPHEDRINE SULFATE 50 MG/ML IJ SOLN
INTRAMUSCULAR | Status: AC
  Filled 2018-02-07: qty 1

## 2018-02-07 MED ORDER — FENTANYL CITRATE (PF) 100 MCG/2ML IJ SOLN: 25.0000 ug | INTRAMUSCULAR | Status: DC | PRN

## 2018-02-07 MED ORDER — ACETAMINOPHEN 10 MG/ML IV SOLN
INTRAVENOUS | Status: DC | PRN
  Administered 2018-02-07: 1000 mg via INTRAVENOUS

## 2018-02-07 MED ORDER — SENNOSIDES-DOCUSATE SODIUM 8.6-50 MG PO TABS: 2.0000 | ORAL_TABLET | Freq: Every day | ORAL | 1 refills | Status: AC | PRN

## 2018-02-07 MED ORDER — ONDANSETRON HCL 4 MG/2ML IJ SOLN
INTRAMUSCULAR | Status: AC
  Filled 2018-02-07: qty 2

## 2018-02-07 MED ORDER — ONDANSETRON HCL 4 MG/2ML IJ SOLN
INTRAMUSCULAR | Status: DC | PRN
  Administered 2018-02-07: 4 mg via INTRAVENOUS

## 2018-02-07 MED ORDER — PHENYLEPHRINE HCL 10 MG/ML IJ SOLN
INTRAMUSCULAR | Status: DC | PRN
  Administered 2018-02-07: 200 ug via INTRAVENOUS
  Administered 2018-02-07 (×2): 100 ug via INTRAVENOUS
  Administered 2018-02-07: 200 ug via INTRAVENOUS
  Administered 2018-02-07 (×3): 100 ug via INTRAVENOUS

## 2018-02-07 MED ORDER — EPHEDRINE SULFATE 50 MG/ML IJ SOLN
INTRAMUSCULAR | Status: DC | PRN
  Administered 2018-02-07: 10 mg via INTRAVENOUS

## 2018-02-07 SURGICAL SUPPLY — 39 items
ADAPTER IRRIG TUBE 2 SPIKE SOL (ADAPTER) ×6 IMPLANT
ADPR TBG 2 SPK PMP STRL ASCP (ADAPTER) ×2
BAG DRN LRG CPC RND TRDRP CNTR (MISCELLANEOUS) ×1
BAG URINE DRAINAGE (UROLOGICAL SUPPLIES) ×3 IMPLANT
BAG URO DRAIN 4000ML (MISCELLANEOUS) ×2 IMPLANT
CATH FOL 2WAY LX 20X30 (CATHETERS) IMPLANT
CATH FOL 2WAY LX 22X30 (CATHETERS) IMPLANT
CATH FOLEY 3WAY 30CC 22FR (CATHETERS) IMPLANT
CATH FOLEY 3WAY 30CC 24FR (CATHETERS) ×3
CATH URETL 5X70 OPEN END (CATHETERS) ×3 IMPLANT
CATH URTH STD 24FR FL 3W 2 (CATHETERS) IMPLANT
CONTAINER COLLECT MORCELLATR (MISCELLANEOUS) ×1 IMPLANT
DRAPE SHEET LG 3/4 BI-LAMINATE (DRAPES) ×3 IMPLANT
DRAPE UTILITY 15X26 TOWEL STRL (DRAPES) ×2 IMPLANT
FILTER OVERFLOW MORCELLATOR (FILTER) ×1 IMPLANT
GLOVE BIOGEL PI IND STRL 7.5 (GLOVE) IMPLANT
GLOVE BIOGEL PI INDICATOR 7.5 (GLOVE) ×4
GOWN STRL REUS W/ TWL LRG LVL3 (GOWN DISPOSABLE) ×2 IMPLANT
GOWN STRL REUS W/TWL LRG LVL3 (GOWN DISPOSABLE) ×6
HOLDER FOLEY CATH W/STRAP (MISCELLANEOUS) ×3 IMPLANT
KIT TURNOVER CYSTO (KITS) ×3 IMPLANT
LASER FIBER 550M SMARTSCOPE (Laser) ×3 IMPLANT
LOOP CUT BIPOLAR 24F LRG (ELECTROSURGICAL) ×2 IMPLANT
MORCELLATOR COLLECT CONTAINER (MISCELLANEOUS) ×3
MORCELLATOR OVERFLOW FILTER (FILTER) ×3
MORCELLATOR ROTATION 4.75 335 (MISCELLANEOUS) ×3 IMPLANT
PACK CYSTO AR (MISCELLANEOUS) ×3 IMPLANT
PLUG CATH AND CAP STER (CATHETERS) ×2 IMPLANT
SENSORWIRE 0.038 NOT ANGLED (WIRE)
SET CYSTO W/LG BORE CLAMP LF (SET/KITS/TRAYS/PACK) IMPLANT
SET IRRIG Y TYPE TUR BLADDER L (SET/KITS/TRAYS/PACK) ×3 IMPLANT
SLEEVE PROTECTION STRL DISP (MISCELLANEOUS) ×2 IMPLANT
SOL .9 NS 3000ML IRR  AL (IV SOLUTION) ×64
SOL .9 NS 3000ML IRR AL (IV SOLUTION) ×32
SOL .9 NS 3000ML IRR UROMATIC (IV SOLUTION) ×4 IMPLANT
SYRINGE IRR TOOMEY STRL 70CC (SYRINGE) ×3 IMPLANT
TUBE PUMP MORCELLATOR PIRANHA (TUBING) ×3 IMPLANT
WATER STERILE IRR 1000ML POUR (IV SOLUTION) ×3 IMPLANT
WIRE SENSOR 0.038 NOT ANGLED (WIRE) IMPLANT

## 2018-02-07 NOTE — Anesthesia Post-op Follow-up Note (Signed)
Anesthesia QCDR form completed.        

## 2018-02-07 NOTE — Telephone Encounter (Signed)
-----   Message from Billey Co, MD sent at 02/07/2018  2:37 PM EDT ----- Regarding: void trial Please schedule nurse visit for foley removal and void trial on Friday 10/11.  Nickolas Madrid, MD 02/07/2018

## 2018-02-07 NOTE — Transfer of Care (Signed)
Immediate Anesthesia Transfer of Care Note  Patient: Logan Aguirre  Procedure(s) Performed: HOLEP-LASER ENUCLEATION OF THE PROSTATE WITH MORCELLATION (N/A )  Patient Location: PACU  Anesthesia Type:General  Level of Consciousness: awake  Airway & Oxygen Therapy: Patient Spontanous Breathing and Patient connected to face mask oxygen  Post-op Assessment: Report given to RN and Post -op Vital signs reviewed and stable  Post vital signs: Reviewed and stable  Last Vitals:  Vitals Value Taken Time  BP 111/76 02/07/2018  1:59 PM  Temp    Pulse 110 02/07/2018  2:00 PM  Resp    SpO2 99 % 02/07/2018  2:00 PM  Vitals shown include unvalidated device data.  Last Pain:  Vitals:   02/07/18 0728  TempSrc: Oral  PainSc: 0-No pain         Complications: No apparent anesthesia complications

## 2018-02-07 NOTE — Telephone Encounter (Signed)
done

## 2018-02-07 NOTE — Anesthesia Procedure Notes (Signed)
Procedure Name: Intubation Date/Time: 02/07/2018 9:18 AM Performed by: Chanetta Marshall, CRNA Pre-anesthesia Checklist: Patient identified, Emergency Drugs available, Suction available and Patient being monitored Patient Re-evaluated:Patient Re-evaluated prior to induction Oxygen Delivery Method: Circle system utilized Preoxygenation: Pre-oxygenation with 100% oxygen Induction Type: IV induction Ventilation: Mask ventilation without difficulty Laryngoscope Size: Mac and 3 Grade View: Grade III Tube type: Oral Tube size: 7.5 mm Number of attempts: 1 Placement Confirmation: ETT inserted through vocal cords under direct vision,  positive ETCO2,  CO2 detector and breath sounds checked- equal and bilateral Secured at: 22 cm Tube secured with: Tape Dental Injury: Teeth and Oropharynx as per pre-operative assessment

## 2018-02-07 NOTE — Anesthesia Preprocedure Evaluation (Signed)
Anesthesia Evaluation  Patient identified by MRN, date of birth, ID band Patient awake    Reviewed: Allergy & Precautions, NPO status , Patient's Chart, lab work & pertinent test results  History of Anesthesia Complications Negative for: history of anesthetic complications  Airway Mallampati: II  TM Distance: >3 FB Neck ROM: Full    Dental no notable dental hx.    Pulmonary neg sleep apnea, neg COPD, former smoker,    breath sounds clear to auscultation- rhonchi (-) wheezing      Cardiovascular hypertension, (-) CAD, (-) Cardiac Stents and (-) CABG + dysrhythmias Atrial Fibrillation  Rhythm:Regular Rate:Normal - Systolic murmurs and - Diastolic murmurs Echo 9/93/71: - Left ventricle: The cavity size was normal. There was mild   concentric hypertrophy. Systolic function was normal. The   estimated ejection fraction was in the range of 55% to 60%. Wall   motion was normal; there were no regional wall motion   abnormalities. Doppler parameters are consistent with abnormal   left ventricular relaxation (grade 1 diastolic dysfunction).   Doppler parameters are consistent with indeterminate ventricular   filling pressure. - Aortic valve: Transvalvular velocity was within the normal range.   There was no stenosis. There was no regurgitation. - Mitral valve: Transvalvular velocity was within the normal range.   There was no evidence for stenosis. There was no regurgitation. - Left atrium: The atrium was moderately dilated. - Right ventricle: The cavity size was mildly dilated. Wall   thickness was normal. Systolic function was normal. - Right atrium: The atrium was moderately dilated. - Atrial septum: No defect or patent foramen ovale was identified   by color flow Doppler. - Tricuspid valve: There was mild regurgitation. - Pulmonary arteries: Systolic pressure was within the normal   range. PA peak pressure: 29 mm Hg (S).    Neuro/Psych TIACVA negative psych ROS   GI/Hepatic Neg liver ROS, GERD  ,  Endo/Other  negative endocrine ROSneg diabetes  Renal/GU negative Renal ROS     Musculoskeletal  (+) Arthritis ,   Abdominal (+) - obese,   Peds  Hematology negative hematology ROS (+)   Anesthesia Other Findings Past Medical History: No date: Arthritis     Comment:  hands / thumbs No date: Atrial flutter ([facility])     Comment:  in setting of URI now on Xarelto for a CHADS2VASC score               of 4 No date: BPH (benign prostatic hyperplasia) No date: Colon polyp No date: GERD (gastroesophageal reflux disease) No date: Gout No date: Hypertension No date: Paroxysmal supraventricular tachycardia ([place])     Comment:  Dr Radford Pax No date: Pure hypercholesterolemia No date: Right bundle branch block     Comment:  normal coronary arteries  2009: Stroke [facility])     Comment:  TIA No date: TIA (transient ischemic attack)     Comment:  TIA, 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   Reproductive/Obstetrics                             Anesthesia Physical Anesthesia Plan  ASA: III  Anesthesia Plan: General   Post-op Pain Management:    Induction: Intravenous  PONV Risk Score and Plan: 1 and Ondansetron and Midazolam  Airway Management Planned: Oral ETT  Additional Equipment:   Intra-op Plan:   Post-operative Plan: Extubation in OR  Informed Consent: I have reviewed the patients History and Physical,  chart, labs and discussed the procedure including the risks, benefits and alternatives for the proposed anesthesia with the patient or authorized representative who has indicated his/her understanding and acceptance.   Dental advisory given  Plan Discussed with: CRNA and Anesthesiologist  Anesthesia Plan Comments:         Anesthesia Quick Evaluation

## 2018-02-07 NOTE — OR Nursing (Signed)
Discussed discharge instructions with pt and wife. Both voice understanding. 

## 2018-02-07 NOTE — Op Note (Addendum)
Date of procedure: 02/07/18  Preoperative diagnosis:  1. BPH with weak stream, nocturia, frequency  Postoperative diagnosis:  1. Same   Procedure: 1. HoLEP  Surgeon: Nickolas Madrid, MD  Anesthesia: General  Complications: None  Intraoperative findings:  1.  Large prostate with trilobar hyperplasia, ureteral orifices orthotopic 2.  Severe bladder trabeculations  EBL: 30 cc  Specimens: Prostate chips  Enucleation time: 203 minutes  Morcellation time: 34 minutes  Intra-op weight: 47g  Drains: 24 [place] three-way  Indication: Logan Aguirre is a 77 y.o. patient with significant urinary symptoms including weak stream, frequency, and nocturia.  Transrectal ultrasound demonstrated a 150 g gland.  After reviewing the management options for treatment, they elected to proceed with the above surgical procedure(s). We have discussed the potential benefits and risks of the procedure, side effects of the proposed treatment, the likelihood of the patient achieving the goals of the procedure, and any potential problems that might occur during the procedure or recuperation.  We specifically discussed the risks of bleeding, infection, hematuria and clot retention, need for additional procedures, possible overnight hospital stay, temporary urgency and urge incontinence, and retrograde ejaculation.  Informed consent has been obtained.   Description of procedure:  The patient was taken to the operating room and general anesthesia was induced.  The patient was placed in the dorsal lithotomy position, prepped and draped in the usual sterile fashion, and preoperative antibiotics(Cipro) were administered.  SCDs were placed for DVT prophylaxis.  A preoperative time-out was performed.   The 64 French continuous flow resectoscope was inserted into the urethra using the visual obturator  The prostate was large, with a moderate sized median lobe and high bladder neck. The bladder was thoroughly inspected  and noted to have severe trabeculations.  The ureteral orifices were located in orthotopic position.  The laser was set to 2 J and 50 Hz and was used to make an incision at the 5 and 7 o'clock position to the level of the capsule from the bladder neck to the verumontanum.  The median lobe was then enucleated.  The lateral lobes were then incised circumferentially until they were disconnected from the surrounding tissue.  The capsule was examined and laser was used for meticulous hemostasis.  The 50 French resectoscope was then switched out for the 45 French nephroscope and the lobes were morcellated and the tissue sent to pathology.  Secondary to his need for anticoagulation with Xarelto, I repassed the 67 French resectoscope and fulgurated a few areas of mild bleeding at the bladder neck with the bipolar loop.    At the conclusion of the procedure the ureteral orifices and verumontanum were clearly identified.  A 93F three-way catheter was inserted easily, and CBI was initiated.  30cc were placed in the balloon.  Urine was clear.  The patient tolerated the procedure well without any immediate complications and was extubated and transferred to the recovery room in stable condition.  Urine was clear on fast CBI.  Disposition: Stable to PACU  Plan: Monitor urine in PACU, likely stop CBI and discharge today Resume Xarelto Thursday 10/10 in the morning if urine clear Void trial in clinic Friday 10/11  Nickolas Madrid, MD

## 2018-02-07 NOTE — H&P (Signed)
UROLOGY H&P UPDATE  Agree with prior H&P dated 01/19/2018  Cardiac: RRR Lungs: CTA bilaterally  Laterality: N/A Procedure: HoLEP  Urine: Urine culture no growth 10/1  Informed consent obtained, we specifically discussed the risk of bleeding, infection, need for additional procedures, retrograde ejaculation, possible overnight hospital stay, temporary urgency and urge incontinence, and very low risk of long term incontinence.  Will discuss timing of resuming xarelto post operatively, likely 24-48 hours.  Billey Co, MD 02/07/2018

## 2018-02-07 NOTE — Anesthesia Postprocedure Evaluation (Signed)
Anesthesia Post Note  Patient: Logan Aguirre  Procedure(s) Performed: HOLEP-LASER ENUCLEATION OF THE PROSTATE WITH MORCELLATION (N/A )  Patient location during evaluation: PACU Anesthesia Type: General Level of consciousness: awake and alert and oriented Pain management: pain level controlled Vital Signs Assessment: post-procedure vital signs reviewed and stable Respiratory status: spontaneous breathing, nonlabored ventilation and respiratory function stable Cardiovascular status: blood pressure returned to baseline and stable Postop Assessment: no signs of nausea or vomiting Anesthetic complications: no     Last Vitals:  Vitals:   02/07/18 1415 02/07/18 1429  BP: 104/71 114/83  Pulse: (!) 110 (!) 110  Resp: 11 14  Temp:    SpO2: 96% 93%    Last Pain:  Vitals:   02/07/18 1429  TempSrc:   PainSc: Asleep                 Jonothan Heberle

## 2018-02-07 NOTE — Discharge Instructions (Signed)

## 2018-02-08 ENCOUNTER — Other Ambulatory Visit: Payer: Self-pay

## 2018-02-08 ENCOUNTER — Observation Stay (HOSPITAL_COMMUNITY)
Admission: EM | Admit: 2018-02-08 | Discharge: 2018-02-09 | Disposition: A | Discharge status: Home / Self Care | Payer: Medicare Other | Attending: Family Medicine | Admitting: Family Medicine

## 2018-02-08 ENCOUNTER — Encounter (HOSPITAL_COMMUNITY): Payer: Self-pay | Admitting: Emergency Medicine

## 2018-02-08 ENCOUNTER — Telehealth: Payer: Self-pay

## 2018-02-08 DIAGNOSIS — N3289 Other specified disorders of bladder: Secondary | ICD-10-CM | POA: Diagnosis not present

## 2018-02-08 DIAGNOSIS — I480 Paroxysmal atrial fibrillation: Principal | ICD-10-CM | POA: Insufficient documentation

## 2018-02-08 DIAGNOSIS — T83091A Other mechanical complication of indwelling urethral catheter, initial encounter: Secondary | ICD-10-CM | POA: Diagnosis not present

## 2018-02-08 DIAGNOSIS — R338 Other retention of urine: Secondary | ICD-10-CM | POA: Diagnosis not present

## 2018-02-08 DIAGNOSIS — N179 Acute kidney failure, unspecified: Secondary | ICD-10-CM | POA: Diagnosis not present

## 2018-02-08 DIAGNOSIS — Z9079 Acquired absence of other genital organ(s): Secondary | ICD-10-CM | POA: Insufficient documentation

## 2018-02-08 DIAGNOSIS — I10 Essential (primary) hypertension: Secondary | ICD-10-CM | POA: Diagnosis not present

## 2018-02-08 DIAGNOSIS — Z79899 Other long term (current) drug therapy: Secondary | ICD-10-CM | POA: Diagnosis not present

## 2018-02-08 DIAGNOSIS — I4892 Unspecified atrial flutter: Secondary | ICD-10-CM | POA: Diagnosis present

## 2018-02-08 DIAGNOSIS — Z466 Encounter for fitting and adjustment of urinary device: Secondary | ICD-10-CM | POA: Diagnosis not present

## 2018-02-08 DIAGNOSIS — R399 Unspecified symptoms and signs involving the genitourinary system: Secondary | ICD-10-CM | POA: Diagnosis present

## 2018-02-08 DIAGNOSIS — D72829 Elevated white blood cell count, unspecified: Secondary | ICD-10-CM | POA: Diagnosis not present

## 2018-02-08 DIAGNOSIS — Z87891 Personal history of nicotine dependence: Secondary | ICD-10-CM | POA: Insufficient documentation

## 2018-02-08 DIAGNOSIS — N401 Enlarged prostate with lower urinary tract symptoms: Secondary | ICD-10-CM | POA: Insufficient documentation

## 2018-02-08 DIAGNOSIS — T839XXA Unspecified complication of genitourinary prosthetic device, implant and graft, initial encounter: Secondary | ICD-10-CM

## 2018-02-08 LAB — BASIC METABOLIC PANEL
Anion gap: 8 (ref 5–15)
BUN: 16 mg/dL (ref 8–23)
CALCIUM: 8 mg/dL — AB (ref 8.9–10.3)
CO2: 19 mmol/L — AB (ref 22–32)
Chloride: 108 mmol/L (ref 98–111)
Creatinine, Ser: 1.43 mg/dL — ABNORMAL HIGH (ref 0.61–1.24)
GFR calc non Af Amer: 45 mL/min — ABNORMAL LOW (ref >60)
GFR, EST AFRICAN AMERICAN: 52 mL/min — AB (ref >60)
Glucose, Bld: 152 mg/dL — ABNORMAL HIGH (ref 70–99)
Potassium: 4 mmol/L (ref 3.5–5.1)
SODIUM: 135 mmol/L (ref 135–145)

## 2018-02-08 LAB — URINALYSIS, ROUTINE W REFLEX MICROSCOPIC
Bilirubin Urine: NEGATIVE
Glucose, UA: NEGATIVE mg/dL
Ketones, ur: NEGATIVE mg/dL
NITRITE: NEGATIVE
PH: 6 (ref 5.0–8.0)
Protein, ur: 30 mg/dL — AB
SPECIFIC GRAVITY, URINE: 1.004 — AB (ref 1.005–1.030)

## 2018-02-08 LAB — CBC WITH DIFFERENTIAL/PLATELET
Abs Immature Granulocytes: 0.05 10*3/uL (ref 0.00–0.07)
BASOS ABS: 0 10*3/uL (ref 0.0–0.1)
Basophils Relative: 0 %
EOS ABS: 0 10*3/uL (ref 0.0–0.5)
Eosinophils Relative: 0 %
HEMATOCRIT: 41.4 % (ref 39.0–52.0)
HEMOGLOBIN: 13.2 g/dL (ref 13.0–17.0)
IMMATURE GRANULOCYTES: 0 %
LYMPHS ABS: 1.1 10*3/uL (ref 0.7–4.0)
LYMPHS PCT: 8 %
MCH: 30.6 pg (ref 26.0–34.0)
MCHC: 31.9 g/dL (ref 30.0–36.0)
MCV: 95.8 fL (ref 80.0–100.0)
Monocytes Absolute: 1 10*3/uL (ref 0.1–1.0)
Monocytes Relative: 7 %
NEUTROS PCT: 85 %
NRBC: 0 % (ref 0.0–0.2)
Neutro Abs: 11.6 10*3/uL — ABNORMAL HIGH (ref 1.7–7.7)
Platelets: 203 10*3/uL (ref 150–400)
RBC: 4.32 MIL/uL (ref 4.22–5.81)
RDW: 14.4 % (ref 11.5–15.5)
WBC: 13.7 10*3/uL — ABNORMAL HIGH (ref 4.0–10.5)

## 2018-02-08 LAB — MRSA PCR SCREENING: MRSA BY PCR: NEGATIVE

## 2018-02-08 LAB — TSH: TSH: 0.572 u[IU]/mL (ref 0.350–4.500)

## 2018-02-08 LAB — TROPONIN I

## 2018-02-08 LAB — I-STAT TROPONIN, ED: TROPONIN I, POC: 0 ng/mL (ref 0.00–0.08)

## 2018-02-08 MED ORDER — VERAPAMIL HCL ER 120 MG PO TBCR
120.0000 mg | EXTENDED_RELEASE_TABLET | Freq: Two times a day (BID) | ORAL | Status: DC
  Administered 2018-02-08 (×2): 120 mg via ORAL
  Filled 2018-02-08 (×3): qty 1

## 2018-02-08 MED ORDER — LORATADINE 10 MG PO TABS
10.0000 mg | ORAL_TABLET | Freq: Every day | ORAL | Status: DC
  Filled 2018-02-08: qty 1

## 2018-02-08 MED ORDER — HYDROCODONE-ACETAMINOPHEN 5-325 MG PO TABS: 1.0000 | ORAL_TABLET | ORAL | Status: DC | PRN

## 2018-02-08 MED ORDER — ALLOPURINOL 300 MG PO TABS
300.0000 mg | ORAL_TABLET | Freq: Every day | ORAL | Status: DC
  Filled 2018-02-08: qty 1

## 2018-02-08 MED ORDER — DILTIAZEM HCL-DEXTROSE 100-5 MG/100ML-% IV SOLN (PREMIX)
5.0000 mg/h | INTRAVENOUS | Status: DC
  Administered 2018-02-08: 5 mg/h via INTRAVENOUS
  Filled 2018-02-08: qty 100

## 2018-02-08 MED ORDER — DILTIAZEM HCL 25 MG/5ML IV SOLN
10.0000 mg | Freq: Once | INTRAVENOUS | Status: AC
  Administered 2018-02-08: 10 mg via INTRAVENOUS
  Filled 2018-02-08: qty 5

## 2018-02-08 MED ORDER — BELLADONNA ALKALOIDS-OPIUM 16.2-60 MG RE SUPP
1.0000 | Freq: Once | RECTAL | Status: AC
  Administered 2018-02-08: 1 via RECTAL
  Filled 2018-02-08: qty 1

## 2018-02-08 MED ORDER — FLUTICASONE PROPIONATE 50 MCG/ACT NA SUSP
1.0000 | Freq: Every day | NASAL | Status: DC | PRN
  Filled 2018-02-08: qty 16

## 2018-02-08 MED ORDER — ALUM & MAG HYDROXIDE-SIMETH 200-200-20 MG/5ML PO SUSP
30.0000 mL | Freq: Four times a day (QID) | ORAL | Status: DC | PRN
  Administered 2018-02-08: 30 mL via ORAL
  Filled 2018-02-08: qty 30

## 2018-02-08 MED ORDER — SODIUM CHLORIDE 0.9% FLUSH
3.0000 mL | Freq: Two times a day (BID) | INTRAVENOUS | Status: DC
  Administered 2018-02-08: 3 mL via INTRAVENOUS

## 2018-02-08 MED ORDER — DOCUSATE SODIUM 100 MG PO CAPS: 100.0000 mg | ORAL_CAPSULE | Freq: Every day | ORAL | Status: DC

## 2018-02-08 MED ORDER — TAMSULOSIN HCL 0.4 MG PO CAPS
0.4000 mg | ORAL_CAPSULE | Freq: Two times a day (BID) | ORAL | Status: DC
  Administered 2018-02-08 (×2): 0.4 mg via ORAL
  Filled 2018-02-08 (×2): qty 1

## 2018-02-08 MED ORDER — PANTOPRAZOLE SODIUM 40 MG PO TBEC
40.0000 mg | DELAYED_RELEASE_TABLET | Freq: Every day | ORAL | Status: DC
  Administered 2018-02-08: 40 mg via ORAL
  Filled 2018-02-08: qty 1

## 2018-02-08 MED ORDER — FINASTERIDE 5 MG PO TABS
5.0000 mg | ORAL_TABLET | Freq: Every day | ORAL | Status: DC
  Administered 2018-02-08: 5 mg via ORAL

## 2018-02-08 MED ORDER — ATORVASTATIN CALCIUM 10 MG PO TABS
10.0000 mg | ORAL_TABLET | Freq: Every day | ORAL | Status: DC
  Filled 2018-02-08: qty 1

## 2018-02-08 MED ORDER — PANTOPRAZOLE SODIUM 40 MG PO TBEC
40.0000 mg | DELAYED_RELEASE_TABLET | Freq: Every day | ORAL | Status: DC
  Filled 2018-02-08: qty 1

## 2018-02-08 MED ORDER — OXYBUTYNIN CHLORIDE 5 MG PO TABS: 5.0000 mg | ORAL_TABLET | Freq: Three times a day (TID) | ORAL | Status: DC | PRN

## 2018-02-08 MED ORDER — SENNOSIDES-DOCUSATE SODIUM 8.6-50 MG PO TABS: 2.0000 | ORAL_TABLET | Freq: Every day | ORAL | Status: DC | PRN

## 2018-02-08 MED ORDER — FINASTERIDE 5 MG PO TABS
5.0000 mg | ORAL_TABLET | Freq: Every day | ORAL | Status: DC
  Filled 2018-02-08: qty 1

## 2018-02-08 NOTE — H&P (Signed)
History and Physical  Patient Name: Logan Aguirre     MIW:[id number]    DOB: 1940/09/09    DOA: 02/08/2018 PCP: Darcus Austin, MD  Patient coming from: Home  Chief Complaint: Urinary catheter problems      HPI: Logan Aguirre is a 77 y.o. male with a past medical history significant for A flutter on Xarelto, RBBB, previous TIA and HTN who presents with urinary problems, found to have outlet obstruction, mild AKI, and A flutter with RVR.  The patient underwent Holmium laser treatment of BPH yesterday, was discharged with a urinary catheter.  Overnight, his foley was not draining, he thought he had leaking around the catheter perhaps, as well as pink tinged urine and abdominal pain.  ED course: -Afebrile, heart rate 120s irregular, blood pressure normal -Na 135, K 4.0, Cr 1.4 (baseline 0.9-1.1), WBC 13.7K, Hgb 13.2 -Troponnin negative -ECG showed Aflutter, old RBBB, rate 120s -Case discussed with URology who recommended irrigation and Belladonna suppositories -IV push of diltiazem no help, and so diltiazem drip started and TRH called for admission     ROS: Review of Systems  Constitutional: Negative for chills, fever and malaise/fatigue.  Respiratory: Negative for cough and shortness of breath.   Cardiovascular: Positive for palpitations. Negative for chest pain, orthopnea, leg swelling and PND.  Gastrointestinal: Positive for abdominal pain.  Genitourinary: Positive for hematuria. Negative for dysuria, flank pain, frequency and urgency.  All other systems reviewed and are negative.         Past Medical History:  Diagnosis Date  . Arthritis    hands / thumbs  . Atrial flutter ([place])    in setting of URI now on Xarelto for a CHADS2VASC score of 4  . BPH (benign prostatic hyperplasia)   . Colon polyp   . GERD (gastroesophageal reflux disease)   . Gout   . Hypertension   . Paroxysmal supraventricular tachycardia ([place])    Dr Radford Pax  . Pure hypercholesterolemia   .  Right bundle branch block    normal coronary arteries   . Stroke [facility]) 2009   TIA  . 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 CARDIAC CATHETERIZATION     normal coronary arteries  . HOLEP-LASER ENUCLEATION OF THE PROSTATE WITH MORCELLATION N/A 02/07/2018   Procedure: HOLEP-LASER ENUCLEATION OF THE PROSTATE WITH MORCELLATION;  Surgeon: Billey Co, MD;  Location: [facility] ORS;  Service: Urology;  Laterality: N/A;  . LEFT HEART CATHETERIZATION WITH CORONARY ANGIOGRAM N/A 03/20/2014   Procedure: LEFT HEART CATHETERIZATION WITH CORONARY ANGIOGRAM;  Surgeon: Burnell Blanks, MD;  Location: [facility] CATH LAB;  Service: Cardiovascular;  Laterality: N/A;  . TONSILLECTOMY      Social History: Patient lives with his wife.  The patient walks independently.  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  NOnsmoker.  Allergies  Allergen Reactions  . Amoxicillin Hives    rash  . Shellfish Allergy Hives    Mainly Shrimp No problems with betadine on the skin   . Zantac [Ranitidine Hcl] Hives    hives    Family history: family history includes COPD in his father; CVA in his brother; Colon cancer in his father; Multiple myeloma in his mother; Suicidality in his brother.  Prior to Admission medications   Medication Sig Start Date End Date Taking? Authorizing Provider  acetaminophen (TYLENOL) 500 MG tablet Take 500 mg by mouth every 6 (six) hours  as needed for mild pain.    Yes [provider]  allopurinol (ZYLOPRIM) 300 MG tablet Take 300 mg by mouth daily.  02/17/15  Yes [provider]  atorvastatin (LIPITOR) 10 MG tablet Take 1 tablet (10 mg total) by mouth daily. 04/19/14  Yes Burtis Junes, NP  docusate sodium (COLACE) 100 MG capsule Take 100 mg by mouth daily.   Yes [provider]  finasteride (PROSCAR) 5 MG tablet Take 5 mg by mouth daily.  02/04/14  Yes [provider]  fluticasone  (FLONASE) 50 MCG/ACT nasal spray Place 1 spray into both nostrils daily as needed for allergies or rhinitis.   Yes [provider]  loratadine (CLARITIN) 10 MG tablet Take 10 mg by mouth daily.   Yes [provider]  omeprazole (PRILOSEC) 20 MG capsule Take 1 capsule (20 mg total) by mouth daily. 04/19/14  Yes Burtis Junes, NP  tamsulosin (FLOMAX) 0.4 MG CAPS capsule Take 0.4 mg by mouth 2 (two) times daily.   Yes [provider]  verapamil (CALAN-SR) 120 MG CR tablet Take 1 tablet (120 mg total) by mouth 2 (two) times daily. 01/23/18 01/23/19 Yes Turner, Eber Hong, MD  HYDROcodone-acetaminophen (NORCO/VICODIN) 5-325 MG tablet Take 1 tablet by mouth every 4 (four) hours as needed for up to 7 days for moderate pain. 02/07/18 02/14/18  Billey Co, MD  senna-docusate (SENOKOT-S) 8.6-50 MG tablet Take 2 tablets by mouth daily as needed for up to 20 days for mild constipation. 02/07/18 02/27/18  Billey Co, MD       Physical Exam: BP (!) 131/91   Pulse 99   Temp 98.3 F (36.8 C) (Oral)   Resp 17   SpO2 98%  General appearance: Well-developed, adult male, alert and in no acute distress.   Eyes: Anicteric, conjunctiva pink, lids and lashes normal. PERRL.    ENT: No nasal deformity, discharge, epistaxis.  Hearing normal. OP moist without lesions.   Skin: Warm and dry.  No jaundice.  No suspicious rashes or lesions. Cardiac: Tachycardic, regular, nl S1-S2, no murmurs appreciated.  Capillary refill is brisk.  JVP normal.  No LE edema.  Radial pulses 2+ and symmetric. Respiratory: Normal respiratory rate and rhythm.  CTAB without rales or wheezes. Abdomen: Abdomen soft.  No TTP. No ascites, distension, hepatosplenomegaly.   MSK: No deformities or effusions.  No cyanosis or clubbing. Neuro: Cranial nerves normal.  Sensation intact to light touch. Speech is fluent.  Muscle strength normal.    Psych: Sensorium intact and responding to questions, attention normal.   Behavior appropriate.  Affect normal.  Judgment and insight appear normal.     Labs on Admission:  I have personally reviewed following labs and imaging studies: CBC: Recent Labs  Lab 02/08/18 0400  WBC 13.7*  NEUTROABS 11.6*  HGB 13.2  HCT 41.4  MCV 95.8  PLT 865   Basic Metabolic Panel: Recent Labs  Lab 02/08/18 0400  NA 135  K 4.0  CL 108  CO2 19*  GLUCOSE 152*  BUN 16  CREATININE 1.43*  CALCIUM 8.0*   GFR: Estimated Creatinine Clearance: 51.4 mL/min (A) (by C-G formula based on SCr of 1.43 mg/dL (H)).  Liver Function Tests: No results for input(s): AST, ALT, ALKPHOS, BILITOT, PROT, ALBUMIN in the last 168 hours. No results for input(s): LIPASE, AMYLASE in the last 168 hours. No results for input(s): AMMONIA in the last 168 hours. Coagulation Profile: No results for input(s): INR, PROTIME in the  last 168 hours. Cardiac Enzymes: No results for input(s): CKTOTAL, CKMB, CKMBINDEX, TROPONINI in the last 168 hours. BNP (last 3 results) No results for input(s): PROBNP in the last 8760 hours. HbA1C: No results for input(s): HGBA1C in the last 72 hours. CBG: No results for input(s): GLUCAP in the last 168 hours. Lipid Profile: No results for input(s): CHOL, HDL, LDLCALC, TRIG, CHOLHDL, LDLDIRECT in the last 72 hours. Thyroid Function Tests: No results for input(s): TSH, T4TOTAL, FREET4, T3FREE, THYROIDAB in the last 72 hours. Anemia Panel: No results for input(s): VITAMINB12, FOLATE, FERRITIN, TIBC, IRON, RETICCTPCT in the last 72 hours. Sepsis Labs:  Invalid input(s): PROCALCITONIN, LACTICACIDVEN Recent Results (from the past 240 hour(s))  Urine culture     Status: None   Collection Time: 01/31/18  2:09 PM  Result Value Ref Range Status   Specimen Description   Final    URINE, RANDOM Performed at [facility], [address], [place], [place] [address]    Special Requests   Final    NONE Performed at [facility], [address]  [place], [place] [address]    Culture   Final    NO GROWTH Performed at [facility], [place] [address] [place], [place] [address]    Report Status 02/02/2018 FINAL  Final         EKG: Independently reviewed. Initial ECG, irregular, rate 122.  Repeat showed again, RBBB, no change.  Rate now slower 95, still irregular, Atrial fibrillation.    Assessment/Plan  1. Atrial flutter with RVR:  Stroke secondary prevention:  CHA2DS2-Vasc 5.  Given history of stroke, would recommend resumption of Xarelto as soon as possible from Urology standpoint.  They have recommended holding one additional day.   -Restart home verapamil -Titrate off diltiazem this morning -Continue on tele -Trend troponins -Check TSH  2. Mild acute kidney injury Urinary obstruction:  Baseline Cr 1.0, now 1.4.  Likely some obstruction.  Foley now draining well.   -Push fluids and monitor I/Os -Repeat BMP tomorrow  3. BPH and recent HOLEP:  -Consult to Urology, appreciate cares -Continue Flomax and Finasteride -Oxybutynin PRN for spasms  4. Hypertension -Continue verapamil  5. Leukocytosis:  Likely reactive.   -Repeat CBC tomorrow      DVT prophylaxis: SCDs  Code Status: FULL  Family Communication: Wife  Disposition Plan: Anticipate oral rate control today, monitor on tele.  If HR stable today, and Cr improves, likely d/c tomorrow Consults called: Urology have evaluated patient Admission status: OBS At the point of initial evaluation, it is my clinical opinion that admission for OBSERVATION is reasonable and necessary because the patient's presenting complaints in the context of their chronic conditions represent sufficient risk of deterioration or significant morbidity to constitute reasonable grounds for close observation in the hospital setting, but that the patient may be medically stable for discharge from the hospital within 24 to 48 hours.    Medical decision making: Patient seen at 8:00 AM on  02/08/2018.  The patient was discussed with nursing.  What exists of the patient's chart was reviewed in depth and summarized above.  Clinical condition: stable.        Edwin Dada [facility] Pager [phone]

## 2018-02-08 NOTE — ED Provider Notes (Addendum)
TIME SEEN: 3:45 AM  CHIEF COMPLAINT: Leaking around Foley catheter  HPI: Patient is a 77 year old male with history of atrial flutter on Xarelto, right bundle branch block, stroke, hypertension who presents to the emergency department with leaking around his Foley catheter.  Patient underwent HOLEP and laser enucleation of the prostate by Dr. Diamantina Providence yesterday at [facility].  States that he was discharged around 6 PM.  He had some leaking around the catheter at discharge which he was told was normal.  States he had some blood in his urine but no clots.  He has not restarted his Xarelto or Lovenox.  He states that since 8 PM he has not had any output into his catheter bag.  He is having discomfort.  States he felt like he had to have a large bowel movement and went to the bathroom and when he strained a large amount of urine leaked around his catheter.  No fever.  Also found to be tachycardic.  Denies chest pain or shortness of breath.  ROS: See HPI Constitutional: no fever  Eyes: no drainage  ENT: no runny nose   Cardiovascular:  no chest pain  Resp: no SOB  GI: no vomiting GU: no dysuria Integumentary: no rash  Allergy: no hives  Musculoskeletal: no leg swelling  Neurological: no slurred speech ROS otherwise negative  PAST MEDICAL HISTORY/PAST SURGICAL HISTORY:  Past Medical History:  Diagnosis Date  . Arthritis    hands / thumbs  . Atrial flutter ([place])    in setting of URI now on Xarelto for a CHADS2VASC score of 4  . BPH (benign prostatic hyperplasia)   . Colon polyp   . GERD (gastroesophageal reflux disease)   . Gout   . Hypertension   . Paroxysmal supraventricular tachycardia ([place])    Dr Radford Pax  . Pure hypercholesterolemia   . Right bundle branch block    normal coronary arteries   . Stroke [facility]) 2009   TIA  . 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 Authorizing Provider  acetaminophen (TYLENOL) 500 MG tablet Take 500 mg by mouth every 6 (six) hours as needed.    [provider]  allopurinol (ZYLOPRIM) 300 MG tablet Take 300 mg by mouth daily.  02/17/15   [provider]  atorvastatin (LIPITOR) 10 MG tablet Take 1 tablet (10 mg total) by mouth daily. 04/19/14   Burtis Junes, NP  docusate sodium (COLACE) 100 MG capsule Take 100 mg by mouth daily.    [provider]  finasteride (PROSCAR) 5 MG tablet Take 5 mg by mouth daily.  02/04/14   [provider]  fluticasone (FLONASE) 50 MCG/ACT nasal spray Place 1 spray into both nostrils daily as needed for allergies or rhinitis.    [provider]  HYDROcodone-acetaminophen (NORCO/VICODIN) 5-325 MG tablet Take 1 tablet by mouth every 4 (four) hours as needed for up to 7 days for moderate pain. 02/07/18 02/14/18  Billey Co, MD  loratadine (CLARITIN) 10 MG tablet Take 10 mg by mouth daily.    [provider]  omeprazole (PRILOSEC) 20 MG capsule Take 1 capsule (20 mg total) by mouth daily. 04/19/14   Burtis Junes, NP  senna-docusate (SENOKOT-S) 8.6-50 MG tablet Take 2 tablets by mouth daily as needed for up to 20 days for mild constipation. 02/07/18 02/27/18  Nickolas Madrid  C, MD  tamsulosin (FLOMAX) 0.4 MG CAPS capsule Take 0.4 mg by mouth 2 (two) times daily.    [provider]  verapamil (CALAN-SR) 120 MG CR tablet Take 1 tablet (120 mg total) by mouth 2 (two) times daily. 01/23/18 01/23/19  Sueanne Margarita, MD    ALLERGIES:  Allergies  Allergen Reactions  . Amoxicillin Hives    rash  . Shellfish Allergy Hives    Mainly Shrimp No problems with betadine on the skin   . Zantac [Ranitidine Hcl] Hives    hives    SOCIAL HISTORY:  Social History   Tobacco Use  . Smoking status: Former Smoker    Types: Cigars, Cigarettes    Last attempt to quit: 1955    Years since quitting: 64.8  . Smokeless tobacco: Never  Used  Substance Use Topics  . Alcohol use: Yes    Alcohol/week: 8.0 standard drinks    Types: 8 Glasses of wine per week    FAMILY HISTORY: Family History  Problem Relation Age of Onset  . Multiple myeloma Mother   . COPD Father   . Colon cancer Father   . Suicidality Brother   . CVA Brother     EXAM: BP 128/74 (BP Location: Right Arm)   Pulse (!) 127   Temp 98.3 F (36.8 C) (Oral)   Resp 17   SpO2 100%  CONSTITUTIONAL: Alert and oriented and responds appropriately to questions.  Appears slightly uncomfortable HEAD: Normocephalic EYES: Conjunctivae clear, pupils appear equal, EOMI ENT: normal nose; moist mucous membranes NECK: Supple, no meningismus, no nuchal rigidity, no LAD  CARD: Irregularly irregular tachycardic; S1 and S2 appreciated; no murmurs, no clicks, no rubs, no gallops RESP: Normal chest excursion without splinting or tachypnea; breath sounds clear and equal bilaterally; no wheezes, no rhonchi, no rales, no hypoxia or respiratory distress, speaking full sentences ABD/GI: Normal bowel sounds; mildly distended in the lower abdomen; soft, non-tender, no rebound, no guarding, no peritoneal signs, no hepatosplenomegaly BACK:  The back appears normal and is non-tender to palpation, there is no CVA tenderness EXT: Normal ROM in all joints; non-tender to palpation; no edema; normal capillary refill; no cyanosis, no calf tenderness or swelling    SKIN: Normal color for age and race; warm; no rash NEURO: Moves all extremities equally PSYCH: The patient's mood and manner are appropriate. Grooming and personal hygiene are appropriate.  MEDICAL DECISION MAKING: Patient here with urinary retention, leaking around his catheter.  He has 193 mL on bladder scan.  There is only approximately 50 mL of bloody appearing urine in his catheter bag at this time.  He has a 72 [place] in place.  We will attempt to irrigate his catheter and discuss with his urologist Dr. Diamantina Providence.    ED  PROGRESS: Discussed with patient's urologist.  Appreciate his help.  He recommends giving a belladonna suppository and attempting to irrigate his bladder.  He does not want [place] to attempt to replace his catheter.   5:00 AM  D/w Dr. Diamantina Providence again.  Patient's nurse reports that she has attempted to irrigate him with approximately 800 mL and then as soon as she reconnects the bag this immediately drains back out but he still has 193 mL in his bladder.  Patient's urologist will come to the ED to see the patient.  Patient's labs today are unremarkable.  His troponin is negative.   5:45 AM  Pt's urologist was under the impression that the patient was at [facility].  He will talk to our urologist to see the patient.  It appears that his catheter is now draining yellow urine.  We will empty his bag, Bladder Scan him and continue to closely monitor.  He is no longer leaking around the catheter and states he is feeling better.  He is still in a flutter with a rapid rate.  We will start diltiazem and admit.  Urologist has asked that we do not anticoagulate patient at this time.  Urology thinks that patient likely had bladder spasm causing his urinary retention.   6:02 AM Discussed patient's case with hospitalist, Dr. Hal Hope.  I have recommended admission and patient (and family if present) agree with this plan. Admitting physician will place admission orders.   I reviewed all nursing notes, vitals, pertinent previous records, EKGs, lab and urine results, imaging (as available).    6:15 AM  Pt now having good flow from his catheter.  Bladder scan shows 60 mL's.  Urine is yellow without blood or clots.  6:40 AM  Pt's rate improving with diltiazem.   EKG Interpretation  Date/Time:  Wednesday February 08 2018 05:20:29 EDT Ventricular Rate:  122 PR Interval:    QRS Duration: 171 QT Interval:  346 QTC Calculation: 493 R Axis:   73 Text Interpretation:  Ectopic atrial tachycardia, unifocal Right  bundle branch block ST depr, consider ischemia, inferior leads A flutter rate is faster than previous Confirmed by Pryor Curia [phone]) on 02/08/2018 6:01:15 AM        EKG Interpretation  Date/Time:  Wednesday February 08 2018 06:41:26 EDT Ventricular Rate:  95 PR Interval:    QRS Duration: 163 QT Interval:  404 QTC Calculation: 508 R Axis:   68 Text Interpretation:  Atrial fibrillation Right bundle branch block ST depr, consider ischemia, inferior leads Confirmed by Pryor Curia [id number]) on 02/08/2018 6:43:43 AM        CRITICAL CARE Performed by: Cyril Mourning Lion Fernandez   Total critical care time: 45 minutes  Critical care time was exclusive of separately billable procedures and treating other patients.  Critical care was necessary to treat or prevent imminent or life-threatening deterioration.  Critical care was time spent personally by me on the following activities: development of treatment plan with patient and/or surrogate as well as nursing, discussions with consultants, evaluation of patient's response to treatment, examination of patient, obtaining history from patient or surrogate, ordering and performing treatments and interventions, ordering and review of laboratory studies, ordering and review of radiographic studies, pulse oximetry and re-evaluation of patient's condition.       Rosmarie Esquibel, Delice Bison, DO 02/08/18 0629    Charlott Calvario, Delice Bison, DO 02/08/18 [phone]

## 2018-02-08 NOTE — ED Notes (Signed)
Preformed bladder scan, scan showed 194 ML's in bladder.

## 2018-02-08 NOTE — Consult Note (Signed)
. H&P Physician requesting consult: Pryor Curia, DO  Chief Complaint: Evaluate Foley catheter status post HOLEP  History of Present Illness: 77 year old male with enlarged prostate with lower urinary tract symptoms underwent a HoLEP by [facility] urology by Dr. Rod Can yesterday.  He was sent home with a Foley catheter.  The patient was initially doing well but noted that his catheter was not draining.  Therefore presented to the emergency department.  He was leaking around his catheter.  Initially, nursing had a difficult time irrigating his bladder but his bladder is now draining clear yellow urine.  He has no other complaints.  However, he has a history of atrial flutter and was found to have an elevated heart rate in relation to this and therefore is being admitted to the hospitalist after being started on a diltiazem drip.  Past Medical History:  Diagnosis Date  . Arthritis    hands / thumbs  . Atrial flutter ([place])    in setting of URI now on Xarelto for a CHADS2VASC score of 4  . BPH (benign prostatic hyperplasia)   . Colon polyp   . GERD (gastroesophageal reflux disease)   . Gout   . Hypertension   . Paroxysmal supraventricular tachycardia ([place])    Dr Radford Pax  . Pure hypercholesterolemia   . Right bundle branch block    normal coronary arteries   . Stroke [facility]) 2009   TIA  . 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 CARDIAC CATHETERIZATION     normal coronary arteries  . HOLEP-LASER ENUCLEATION OF THE PROSTATE WITH MORCELLATION N/A 02/07/2018   Procedure: HOLEP-LASER ENUCLEATION OF THE PROSTATE WITH MORCELLATION;  Surgeon: Billey Co, MD;  Location: [facility] ORS;  Service: Urology;  Laterality: N/A;  . LEFT HEART CATHETERIZATION WITH CORONARY ANGIOGRAM N/A 03/20/2014   Procedure: LEFT HEART CATHETERIZATION WITH CORONARY ANGIOGRAM;  Surgeon: Burnell Blanks, MD;  Location: [facility] CATH LAB;   Service: Cardiovascular;  Laterality: N/A;  . TONSILLECTOMY      Home Medications:   (Not in a hospital admission) Allergies:  Allergies  Allergen Reactions  . Amoxicillin Hives    rash  . Shellfish Allergy Hives    Mainly Shrimp No problems with betadine on the skin   . Zantac [Ranitidine Hcl] Hives    hives    Family History  Problem Relation Age of Onset  . Multiple myeloma Mother   . COPD Father   . Colon cancer Father   . Suicidality Brother   . CVA Brother    Social History:  reports that he quit smoking about 64 years ago. His smoking use included cigars and cigarettes. He has never used smokeless tobacco. He reports that he drinks about 8.0 standard drinks of alcohol per week. He reports that he does not use drugs.  ROS: A complete review of systems was performed.  All systems are negative except for pertinent findings as noted. ROS   Physical Exam:  Vital signs in last 24 hours: Temp:  [96.7 F (35.9 C)-98.3 F (36.8 C)] 98.3 F (36.8 C) (10/09 0240) Pulse Rate:  [80-127] 94 (10/09 0700) Resp:  [11-19] 19 (10/09 0700) BP: (100-143)/(67-108) 119/108 (10/09 0700) SpO2:  [93 %-100 %] 95 % (10/09 0700) Weight:  [97 kg] 97 kg (10/08 0728) General:  Alert and oriented, No acute distress HEENT: Normocephalic, atraumatic Neck: No JVD or lymphadenopathy Cardiovascular: Regular rate and rhythm  Lungs: Regular rate and effort Abdomen: Soft, nontender, nondistended, no abdominal masses Back: No CVA tenderness  Urinary: Three-way Foley catheter in place draining clear yellow urine Extremities: No edema Neurologic: Grossly intact  Laboratory Data:  Results for orders placed or performed during the hospital encounter of 02/08/18 (from the past 24 hour(s))  CBC with Differential     Status: Abnormal   Collection Time: 02/08/18  4:00 AM  Result Value Ref Range   WBC 13.7 (H) 4.0 - 10.5 K/uL   RBC 4.32 4.22 - 5.81 MIL/uL   Hemoglobin 13.2 13.0 - 17.0 g/dL   HCT  41.4 39.0 - 52.0 %   MCV 95.8 80.0 - 100.0 fL   MCH 30.6 26.0 - 34.0 pg   MCHC 31.9 30.0 - 36.0 g/dL   RDW 14.4 11.5 - 15.5 %   Platelets 203 150 - 400 K/uL   nRBC 0.0 0.0 - 0.2 %   Neutrophils Relative % 85 %   Neutro Abs 11.6 (H) 1.7 - 7.7 K/uL   Lymphocytes Relative 8 %   Lymphs Abs 1.1 0.7 - 4.0 K/uL   Monocytes Relative 7 %   Monocytes Absolute 1.0 0.1 - 1.0 K/uL   Eosinophils Relative 0 %   Eosinophils Absolute 0.0 0.0 - 0.5 K/uL   Basophils Relative 0 %   Basophils Absolute 0.0 0.0 - 0.1 K/uL   Immature Granulocytes 0 %   Abs Immature Granulocytes 0.05 0.00 - 0.07 K/uL  Basic metabolic panel     Status: Abnormal   Collection Time: 02/08/18  4:00 AM  Result Value Ref Range   Sodium 135 135 - 145 mmol/L   Potassium 4.0 3.5 - 5.1 mmol/L   Chloride 108 98 - 111 mmol/L   CO2 19 (L) 22 - 32 mmol/L   Glucose, Bld 152 (H) 70 - 99 mg/dL   BUN 16 8 - 23 mg/dL   Creatinine, Ser 1.43 (H) 0.61 - 1.24 mg/dL   Calcium 8.0 (L) 8.9 - 10.3 mg/dL   GFR calc non Af Amer 45 (L) >60 mL/min   GFR calc Af Amer 52 (L) >60 mL/min   Anion gap 8 5 - 15  I-stat troponin, ED     Status: None   Collection Time: 02/08/18  4:12 AM  Result Value Ref Range   Troponin i, poc 0.00 0.00 - 0.08 ng/mL   Comment 3          Urinalysis, Routine w reflex microscopic     Status: Abnormal   Collection Time: 02/08/18  6:32 AM  Result Value Ref Range   Color, Urine AMBER (A) YELLOW   APPearance CLEAR CLEAR   Specific Gravity, Urine 1.004 (L) 1.005 - 1.030   pH 6.0 5.0 - 8.0   Glucose, UA NEGATIVE NEGATIVE mg/dL   Hgb urine dipstick LARGE (A) NEGATIVE   Bilirubin Urine NEGATIVE NEGATIVE   Ketones, ur NEGATIVE NEGATIVE mg/dL   Protein, ur 30 (A) NEGATIVE mg/dL   Nitrite NEGATIVE NEGATIVE   Leukocytes, UA SMALL (A) NEGATIVE   RBC / HPF 21-50 0 - 5 RBC/hpf   WBC, UA 11-20 0 - 5 WBC/hpf   Bacteria, UA RARE (A) NONE SEEN   Squamous Epithelial / LPF 0-5 0 - 5   Mucus PRESENT    Recent Results (from the  past 240 hour(s))  Urine culture     Status: None   Collection Time: 01/31/18  2:09 PM  Result Value Ref Range Status   Specimen Description  Final    URINE, RANDOM Performed at [facility], [address] [place], [place] [address]    Special Requests   Final    NONE Performed at [facility], [address] [place], [place] [address]    Culture   Final    NO GROWTH Performed at [facility], [place] [address], [place], [place] [address]    Report Status 02/02/2018 FINAL  Final   Creatinine: Recent Labs    02/08/18 0400  CREATININE 1.43*    Impression/Assessment:  Bladder spasms BPH with lower urinary tract symptoms  Plan:  Recommend oxybutynin 5 mg as needed for bladder spasms.  Continue Foley catheter.  Try to avoid anticoagulation if possible given his recent prostate procedure and higher risk of bleeding in this setting.  Please call with any questions or concerns.  Marton Redwood, III 02/08/2018, 7:22 AM

## 2018-02-08 NOTE — ED Notes (Addendum)
Bladder scan: 60 mL

## 2018-02-08 NOTE — ED Triage Notes (Signed)
Patient reports urinary retention/bladder pressure , no drainage at foley catheter and leaking at urethra onset this evening , pt. had a prostate laser enucleation procedure done yesterday at [place] .

## 2018-02-08 NOTE — Telephone Encounter (Signed)
Received fax from [facility] call center, pt reported leakage from penis instead of catheter. Pt currently being treated at [facility] ED

## 2018-02-09 ENCOUNTER — Telehealth: Payer: Self-pay | Admitting: Family Medicine

## 2018-02-09 ENCOUNTER — Telehealth: Payer: Self-pay | Admitting: Urology

## 2018-02-09 DIAGNOSIS — N179 Acute kidney failure, unspecified: Secondary | ICD-10-CM | POA: Diagnosis not present

## 2018-02-09 DIAGNOSIS — R338 Other retention of urine: Secondary | ICD-10-CM | POA: Diagnosis not present

## 2018-02-09 DIAGNOSIS — I4892 Unspecified atrial flutter: Secondary | ICD-10-CM | POA: Diagnosis not present

## 2018-02-09 DIAGNOSIS — I480 Paroxysmal atrial fibrillation: Secondary | ICD-10-CM | POA: Diagnosis not present

## 2018-02-09 LAB — BASIC METABOLIC PANEL
Anion gap: 3 — ABNORMAL LOW (ref 5–15)
BUN: 7 mg/dL — AB (ref 8–23)
CHLORIDE: 108 mmol/L (ref 98–111)
CO2: 28 mmol/L (ref 22–32)
CREATININE: 1.01 mg/dL (ref 0.61–1.24)
Calcium: 8.1 mg/dL — ABNORMAL LOW (ref 8.9–10.3)
GFR calc Af Amer: >60 mL/min (ref >60)
GLUCOSE: 121 mg/dL — AB (ref 70–99)
POTASSIUM: 4 mmol/L (ref 3.5–5.1)
Sodium: 139 mmol/L (ref 135–145)

## 2018-02-09 LAB — CBC
HCT: 37.9 % — ABNORMAL LOW (ref 39.0–52.0)
Hemoglobin: 12.1 g/dL — ABNORMAL LOW (ref 13.0–17.0)
MCH: 30.5 pg (ref 26.0–34.0)
MCHC: 31.9 g/dL (ref 30.0–36.0)
MCV: 95.5 fL (ref 80.0–100.0)
NRBC: 0 % (ref 0.0–0.2)
PLATELETS: 175 10*3/uL (ref 150–400)
RBC: 3.97 MIL/uL — ABNORMAL LOW (ref 4.22–5.81)
RDW: 14.6 % (ref 11.5–15.5)
WBC: 10.1 10*3/uL (ref 4.0–10.5)

## 2018-02-09 LAB — SURGICAL PATHOLOGY

## 2018-02-09 MED ORDER — OXYBUTYNIN CHLORIDE 5 MG PO TABS: 5.0000 mg | ORAL_TABLET | Freq: Three times a day (TID) | ORAL | 0 refills | Status: DC | PRN

## 2018-02-09 NOTE — Telephone Encounter (Signed)
-----   Message from Billey Co, MD sent at 02/09/2018 12:55 PM EDT ----- Please let him know the pathology from his HOLEP showed only benign prostate tissue, no worrisome cells or cancer seen.  Nickolas Madrid, MD

## 2018-02-09 NOTE — Addendum Note (Signed)
Encounter addended by: Sherran Needs, NP on: 02/09/2018 4:31 PM  Actions taken: LOS modified

## 2018-02-09 NOTE — Telephone Encounter (Signed)
Patient notified

## 2018-02-09 NOTE — Discharge Summary (Signed)
Physician Discharge Summary  Logan Aguirre WFU:[id number] DOB: 04-02-1941 DOA: 02/08/2018  PCP: Darcus Austin, MD  Admit date: 02/08/2018 Discharge date: 02/09/2018  Admitted From: Home  Disposition:  Home   Recommendations for Outpatient Follow-up:  1. Follow up with Urology tomorrow 2. Follow up with PCP in 1 week   Home Health: None  Equipment/Devices: None  Discharge Condition: Good  CODE STATUS: FULL Diet recommendation: Cardiac  Brief/Interim Summary: Logan Aguirre is a 77 y.o. male with a past medical history significant for A flutter on Xarelto, RBBB, previous TIA and HTN who presents with foley discomfort, found to have urinary retention, mild AKI, and A flutter with RVR.  The patient underwent Holmium laser treatment of BPH day before admission, was discharged with a urinary catheter.  Overnight, his foley was not draining, he thought he had leaking around the catheter perhaps, as well as pink tinged urine and abdominal pain.  In the ER he was found to have AKI, Afib with RVR.  Diltiazem bolus did not improve HR, and so he was started on drip, admitted for observation.        Discharge Diagnoses:   Atrial flutter with RVR CHA2DS2-Vasc 5.  Restarted on home verapamil and HR remained in 80s overnight.  Today, he was without dyspnea, palpitations, chest pain; HR remained normal in Afib.  Troponins negative.  TSH normal.  Discussed with Dr. Diamantina Providence by phone, okay to restart Xarelto tonight, as he has close follow up tomorrow.  Called patient immediately after by phone, confirmed same.  Patient expressed understanding.     Mild acute kidney injury Urinary retention Cr 1.4 at admission from baseline 1.0.  Improved with fluids, relief of bladder spasm vs outlet obstruction.  BPH and recent HOLEP:  Pink tinged urine at admission.  Better today, equivocal if there is any hematuria today.  Hypertension Normotensive  Leukocytosis:  Reactive from procedure,  urinary retention.  Normalized today.      Discharge Instructions  Discharge Instructions    Call MD for:  temperature >100.4   Complete by:  As directed    Diet - low sodium heart healthy   Complete by:  As directed    Discharge instructions   Complete by:  As directed    From Dr. Loleta Books: You were admitted to the hospital with urine retention (and mild kidney injury) as well as out of control (fast) Atrial Flutter.  In the hospital, your foley was irrigated and you were treated with a medicine for bladder spasms, and that appeared to fix the urine retention. If you have more bladder spasms (this would mean pain in your bladder again), try oxybutynin 5 mg as needed, up to every eight hours.  Continue your home verapamil. With regard to your blood thinner, I recommend restarting this as soon as possible.  Call Dr. Doristine Counter office today for further instructions on when to restart, preferably as soon as possible.   Increase activity slowly   Complete by:  As directed      Allergies as of 02/09/2018      Reactions   Amoxicillin Hives, Rash   Shellfish Allergy Hives   Mainly Shrimp No problems with betadine on the skin   Zantac [ranitidine Hcl] Hives      Medication List    TAKE these medications   acetaminophen 500 MG tablet Commonly known as:  TYLENOL Take 500 mg by mouth every 6 (six) hours as needed for mild pain.   allopurinol 300 MG  tablet Commonly known as:  ZYLOPRIM Take 300 mg by mouth at bedtime.   atorvastatin 10 MG tablet Commonly known as:  LIPITOR Take 1 tablet (10 mg total) by mouth daily. What changed:  when to take this   docusate sodium 100 MG capsule Commonly known as:  COLACE Take 100 mg by mouth daily.   finasteride 5 MG tablet Commonly known as:  PROSCAR Take 5 mg by mouth at bedtime.   fluticasone 50 MCG/ACT nasal spray Commonly known as:  FLONASE Place 1 spray into both nostrils daily as needed for allergies or rhinitis.    HYDROcodone-acetaminophen 5-325 MG tablet Commonly known as:  NORCO/VICODIN Take 1 tablet by mouth every 4 (four) hours as needed for up to 7 days for moderate pain.   ibuprofen 200 MG tablet Commonly known as:  ADVIL,MOTRIN Take 200 mg by mouth every 6 (six) hours as needed (for pain).   loratadine 10 MG tablet Commonly known as:  CLARITIN Take 10 mg by mouth daily as needed (for allergies or congestion).   omeprazole 20 MG capsule Commonly known as:  PRILOSEC Take 1 capsule (20 mg total) by mouth daily. What changed:  when to take this   oxybutynin 5 MG tablet Commonly known as:  DITROPAN Take 1 tablet (5 mg total) by mouth every 8 (eight) hours as needed for bladder spasms.   rivaroxaban 20 MG Tabs tablet Commonly known as:  XARELTO Take 20 mg by mouth at bedtime.   senna-docusate 8.6-50 MG tablet Commonly known as:  Senokot-S Take 2 tablets by mouth daily as needed for up to 20 days for mild constipation.   tamsulosin 0.4 MG Caps capsule Commonly known as:  FLOMAX Take 0.4 mg by mouth See admin instructions. Take 0.4 mg by mouth 2 times a day- morning and bedtime   verapamil 120 MG CR tablet Commonly known as:  CALAN-SR Take 1 tablet (120 mg total) by mouth 2 (two) times daily. What changed:    when to take this  additional instructions       Allergies  Allergen Reactions  . Amoxicillin Hives and Rash  . Shellfish Allergy Hives    Mainly Shrimp No problems with betadine on the skin   . Zantac [Ranitidine Hcl] Hives    Consultations:  Urology   Procedures/Studies:  No results found.    Subjective: Feeling well.  No chest pain, palpitations, dyspnea.  No gross hematuria. No cough.  Discharge Exam: Vitals:   02/08/18 2138 02/09/18 0514  BP: (!) 148/86 140/82  Pulse: 85 81  Resp: (!) 21 17  Temp: 98.3 F (36.8 C) 98.5 F (36.9 C)  SpO2: 100% 96%   Vitals:   02/08/18 1400 02/08/18 1430 02/08/18 2138 02/09/18 0514  BP: 109/71 119/64  (!) 148/86 140/82  Pulse: 92 85 85 81  Resp:   (!) 21 17  Temp:  98.2 F (36.8 C) 98.3 F (36.8 C) 98.5 F (36.9 C)  TempSrc:  Oral Oral Oral  SpO2: 94% 96% 100% 96%  Weight:    96.3 kg    General: Pt is alert, awake, not in acute distress, lying in bed, interactive Cardiovascular: Regular rate, irregular rhythm, H6/W7 +, soft systolic murmur, no rubs, no gallops Respiratory: CTA bilaterally, no wheezing, no rhonchi Abdominal: Soft, NT, ND, bowel sounds + Extremities: no edema, no cyanosis GU: Pinkish or maybe just dark yellow urine in bag    The results of significant diagnostics from this hospitalization (including imaging, microbiology, ancillary and  laboratory) are listed below for reference.     Microbiology: Recent Results (from the past 240 hour(s))  Urine culture     Status: None   Collection Time: 01/31/18  2:09 PM  Result Value Ref Range Status   Specimen Description   Final    URINE, RANDOM Performed at [facility], [address] [place], [place] [address]    Special Requests   Final    NONE Performed at [facility], [address], [place], [place] [address]    Culture   Final    NO GROWTH Performed at [facility], [place] [address], [place], [place] [address]    Report Status 02/02/2018 FINAL  Final  MRSA PCR Screening     Status: None   Collection Time: 02/08/18  8:47 AM  Result Value Ref Range Status   MRSA by PCR NEGATIVE NEGATIVE Final    Comment:        The GeneXpert MRSA Assay (FDA approved for NASAL specimens only), is one component of a comprehensive MRSA colonization surveillance program. It is not intended to diagnose MRSA infection nor to guide or monitor treatment for MRSA infections. Performed at [facility], [place] [address] [place], [place] [address]      Labs: BNP (last 3 results) No results for input(s): BNP in the last 8760 hours. Basic Metabolic Panel: Recent Labs  Lab 02/08/18 0400  02/09/18 0321  NA 135 139  K 4.0 4.0  CL 108 108  CO2 19* 28  GLUCOSE 152* 121*  BUN 16 7*  CREATININE 1.43* 1.01  CALCIUM 8.0* 8.1*   Liver Function Tests: No results for input(s): AST, ALT, ALKPHOS, BILITOT, PROT, ALBUMIN in the last 168 hours. No results for input(s): LIPASE, AMYLASE in the last 168 hours. No results for input(s): AMMONIA in the last 168 hours. CBC: Recent Labs  Lab 02/08/18 0400 02/09/18 0321  WBC 13.7* 10.1  NEUTROABS 11.6*  --   HGB 13.2 12.1*  HCT 41.4 37.9*  MCV 95.8 95.5  PLT 203 175   Cardiac Enzymes: Recent Labs  Lab 02/08/18 0852 02/08/18 1437  TROPONINI <0.03 <0.03   BNP: Invalid input(s): POCBNP CBG: No results for input(s): GLUCAP in the last 168 hours. D-Dimer No results for input(s): DDIMER in the last 72 hours. Hgb A1c No results for input(s): HGBA1C in the last 72 hours. Lipid Profile No results for input(s): CHOL, HDL, LDLCALC, TRIG, CHOLHDL, LDLDIRECT in the last 72 hours. Thyroid function studies Recent Labs    02/08/18 0853  TSH 0.572   Anemia work up No results for input(s): VITAMINB12, FOLATE, FERRITIN, TIBC, IRON, RETICCTPCT in the last 72 hours. Urinalysis    Component Value Date/Time   COLORURINE AMBER (A) 02/08/2018 0632   APPEARANCEUR CLEAR 02/08/2018 0632   LABSPEC 1.004 (L) 02/08/2018 0632   PHURINE 6.0 02/08/2018 0632   GLUCOSEU NEGATIVE 02/08/2018 0632   HGBUR LARGE (A) 02/08/2018 0632   BILIRUBINUR NEGATIVE 02/08/2018 0632   KETONESUR NEGATIVE 02/08/2018 0632   PROTEINUR 30 (A) 02/08/2018 0632   UROBILINOGEN 1.0 11/07/2007 2113   NITRITE NEGATIVE 02/08/2018 0632   LEUKOCYTESUR SMALL (A) 02/08/2018 0623   Sepsis Labs Invalid input(s): PROCALCITONIN,  WBC,  LACTICIDVEN Microbiology Recent Results (from the past 240 hour(s))  Urine culture     Status: None   Collection Time: 01/31/18  2:09 PM  Result Value Ref Range Status   Specimen Description   Final    URINE, RANDOM  Performed at [facility], [address] [place], [place] [address]    Special Requests   Final    NONE Performed at [facility], [address] [place], [place] [address]    Culture   Final    NO GROWTH Performed at [facility], [place] [address] [place], [place] [address]    Report Status 02/02/2018 FINAL  Final  MRSA PCR Screening     Status: None   Collection Time: 02/08/18  8:47 AM  Result Value Ref Range Status   MRSA by PCR NEGATIVE NEGATIVE Final    Comment:        The GeneXpert MRSA Assay (FDA approved for NASAL specimens only), is one component of a comprehensive MRSA colonization surveillance program. It is not intended to diagnose MRSA infection nor to guide or monitor treatment for MRSA infections. Performed at [facility], [place] [address] [place], [place] [address]      Time coordinating discharge: 40 minutes       SIGNED:   Edwin Dada, MD  [facility] 02/09/2018, 1:10 PM

## 2018-02-09 NOTE — Telephone Encounter (Signed)
Patient's wife called, they have been discharged from [facility] this morning, the discharge nurse told him to go ahead and start the Xarelto today. They are confirming with you before he starts the medication to be sure this is ok. Please advise  Wife Margaret's # [phone]

## 2018-02-10 ENCOUNTER — Ambulatory Visit (INDEPENDENT_AMBULATORY_CARE_PROVIDER_SITE_OTHER): Payer: Medicare Other | Admitting: Family Medicine

## 2018-02-10 DIAGNOSIS — N401 Enlarged prostate with lower urinary tract symptoms: Secondary | ICD-10-CM

## 2018-02-10 DIAGNOSIS — R3914 Feeling of incomplete bladder emptying: Secondary | ICD-10-CM

## 2018-02-10 NOTE — Progress Notes (Addendum)
Fill and Pull Catheter Removal  Patient is present today for a catheter removal.  Patient was cleaned and prepped in a sterile fashion 278ml of sterile water/ saline was instilled into the bladder when the patient felt the urge to urinate. 22ml of water was then drained from the balloon.  A 22FR foley cath was removed from the bladder no complications were noted .  Patient as then given some time to void on their own.  Patient can void  238ml on their own after some time.  Patient tolerated well.  Preformed by: Elberta Leatherwood, CMA

## 2018-02-13 ENCOUNTER — Telehealth: Payer: Self-pay | Admitting: Urology

## 2018-02-13 NOTE — Telephone Encounter (Signed)
Provided Dr. Diamantina Providence with the after hours call report from patient regarding his concerns from call on 02/12/18.  Patient reported that he has increased bleeding, burning, suprapubic pain, frequency.  The on call physician, Dr. Louis Meckel advised patient to increase water intake, stop taking Xarelto and call the office for further instructions.    Dr. Diamantina Providence to call patient to advise.

## 2018-02-14 ENCOUNTER — Encounter: Payer: Self-pay | Admitting: Internal Medicine

## 2018-02-14 ENCOUNTER — Telehealth: Payer: Self-pay | Admitting: Family Medicine

## 2018-02-14 NOTE — Telephone Encounter (Signed)
-----   Message from Billey Co, MD sent at 02/14/2018 12:44 PM EDT ----- Regarding: patient call Please tell patient he can continue taking flomax for now, we will likely discontinue it at his follow up with me if he is urinating well.  Nickolas Madrid, MD 02/14/2018

## 2018-02-14 NOTE — Telephone Encounter (Signed)
Patient notified, he is not booked for a follow up. When would you like to see him back?

## 2018-02-15 ENCOUNTER — Telehealth: Payer: Self-pay | Admitting: Cardiology

## 2018-02-15 ENCOUNTER — Telehealth: Payer: Self-pay | Admitting: Family Medicine

## 2018-02-15 NOTE — Telephone Encounter (Signed)
Appointment scheduled.

## 2018-02-15 NOTE — Telephone Encounter (Signed)
Spoke with Dr. Diamantina Providence, the patient was advised to hold Xarelto for 48 hrs and should restart today.

## 2018-02-15 NOTE — Telephone Encounter (Signed)
Please call on call surgon back to find out when he can restart Xarelto

## 2018-02-15 NOTE — Telephone Encounter (Signed)
Attempted to call patient, left a voicemail. Attempted to call Urologist, the contact is out of the office till the 18th. Per documentation in Epic, the patient had a HOLEP approved by preop. The surgery was on 10/08. The patient was discharged and readmitted due to foley catheter not draining and they also found afib RVR. On 10/10 the patient was instructed to take Xarelto again. The surgery was completed and after the pain had complications, ended up having bleeding, burning and suprapubic pain on 10/13. Per Dr. Louis Meckel, on call doc, the patient should hold Xarelto on 10/13.

## 2018-02-15 NOTE — Telephone Encounter (Signed)
ok 

## 2018-02-15 NOTE — Telephone Encounter (Signed)
Please call Urologist to find out what is going on.  He is very high risk to be off anticoagulation

## 2018-02-15 NOTE — Telephone Encounter (Signed)
-----   Message from Billey Co, MD sent at 02/14/2018  1:52 PM EDT ----- Regarding: follow up Please arrange follow up with me in 5-6 weeks for uroflow and PVR.  Nickolas Madrid, MD 02/14/2018

## 2018-02-15 NOTE — Telephone Encounter (Signed)
New Message    Pt c/o medication issue:  1. Name of Medication: Xarelto  2. How are you currently taking this medication (dosage and times per day)?   3. Are you having a reaction (difficulty breathing--STAT)?   4. What is your medication issue? Patients wife is calling because he was advised to stop taking his Xarelto by his urologist. They wanted to inform the cardiologist. .

## 2018-02-22 DIAGNOSIS — K219 Gastro-esophageal reflux disease without esophagitis: Secondary | ICD-10-CM | POA: Diagnosis not present

## 2018-02-22 DIAGNOSIS — N400 Enlarged prostate without lower urinary tract symptoms: Secondary | ICD-10-CM | POA: Diagnosis not present

## 2018-02-22 DIAGNOSIS — Z0000 Encounter for general adult medical examination without abnormal findings: Secondary | ICD-10-CM | POA: Diagnosis not present

## 2018-02-22 DIAGNOSIS — Z23 Encounter for immunization: Secondary | ICD-10-CM | POA: Diagnosis not present

## 2018-02-22 DIAGNOSIS — M109 Gout, unspecified: Secondary | ICD-10-CM | POA: Diagnosis not present

## 2018-02-22 DIAGNOSIS — E7800 Pure hypercholesterolemia, unspecified: Secondary | ICD-10-CM | POA: Diagnosis not present

## 2018-02-22 DIAGNOSIS — I4892 Unspecified atrial flutter: Secondary | ICD-10-CM | POA: Diagnosis not present

## 2018-02-27 ENCOUNTER — Ambulatory Visit (INDEPENDENT_AMBULATORY_CARE_PROVIDER_SITE_OTHER): Payer: Medicare Other | Admitting: Family Medicine

## 2018-02-27 ENCOUNTER — Telehealth: Payer: Self-pay | Admitting: Radiology

## 2018-02-27 ENCOUNTER — Telehealth: Payer: Self-pay | Admitting: Cardiology

## 2018-02-27 DIAGNOSIS — R310 Gross hematuria: Secondary | ICD-10-CM | POA: Diagnosis not present

## 2018-02-27 NOTE — Telephone Encounter (Signed)
New message  Pt c/o medication issue:  1. Name of Medication:rivaroxaban (XARELTO) 20 MG TABS tablet  2. How are you currently taking this medication (dosage and times per day)? 1 time daily at bedtime  3. Are you having a reaction (difficulty breathing--STAT)? No   4. What is your medication issue? Patient's wife states that the Therapist, music) surgeon recommends that he go off this medication for a week to heal. Patient has blood in his urine. Patient had holet procedure on 02/07/2018.

## 2018-02-27 NOTE — Progress Notes (Signed)
UROLOGY PROGRESS NOTE  I saw [name] as an add-on in urology clinic for gross hematuria.  Briefly, he is a 77 year old male with atrial flutter and history of stroke who is status post HOLEP for 150 cc prostate on 01/19/2018.  He did have some issues with hematuria when restarting his Xarelto postop.  He has been voiding well with a very strong stream, and his PVR in clinic today is 84 cc.  He reports his urine is [place] colored with a small clots smaller than dime.  We discussed the risks and benefits of holding Xarelto at length in the setting of healing and re-epithelialization post HOLEP.  I recommended holding Xarelto for 7 days and pushing fluids.  If he continues to have gross hematuria despite this, or again when he restarts his Xarelto next week, we will consider going to the OR for cystoscopy and fulguration of any residual raw tissue or bleeding.  Logan Madrid, MD 02/27/2018

## 2018-02-27 NOTE — Telephone Encounter (Signed)
Patient reports continued bleeding since HOLEP performed on 02/07/2018. Today he has been passing clots. Nurse appointment made for today.

## 2018-02-27 NOTE — Telephone Encounter (Signed)
Need to do what Urology recommends but patient at very high risk being off anticoagulation given history of CVA off anticoagulation in the past.  Please relay this to Urology who needs to weigh the risk of bleeding against known risk of CVA off anticoagulation.  ALso let patient know

## 2018-02-28 NOTE — Telephone Encounter (Signed)
Spoke with patient's wife, Dr. Radford Pax advised to do what Urology recommends but to be aware that he is at a high risk being off of anticoagulation. Sending info to Urology office to review.

## 2018-03-07 ENCOUNTER — Telehealth: Payer: Self-pay | Admitting: Urology

## 2018-03-07 NOTE — Telephone Encounter (Signed)
Patient called and wants to know if you would have a minute to call him back to discuss Xarelto? He said you took him off of it and he has some questions   Sharyn Lull

## 2018-03-10 ENCOUNTER — Encounter: Payer: Self-pay | Admitting: Internal Medicine

## 2018-03-10 ENCOUNTER — Ambulatory Visit (INDEPENDENT_AMBULATORY_CARE_PROVIDER_SITE_OTHER): Payer: Medicare Other | Admitting: Internal Medicine

## 2018-03-10 VITALS — BP 120/64 | HR 85 | Ht 76.0 in | Wt 206.0 lb

## 2018-03-10 DIAGNOSIS — I483 Typical atrial flutter: Secondary | ICD-10-CM

## 2018-03-10 NOTE — Patient Instructions (Addendum)
Medication Instructions:  Your physician recommends that you continue on your current medications as directed. Please refer to the Current Medication list given to you today.  Labwork: You will get lab work on April 14, 2018:  BMP and CBC. You can come to the office any time between 8:00 am and 5:00 pm that day.  You do not need to be fasting.  Testing/Procedures: Your physician has recommended that you have an ablation. Catheter ablation is a medical procedure used to treat some cardiac arrhythmias (irregular heartbeats). During catheter ablation, a long, thin, flexible tube is put into a blood vessel in your groin (upper thigh), or neck. This tube is called an ablation catheter. It is then guided to your heart through the blood vessel. Radio frequency waves destroy small areas of heart tissue where abnormal heartbeats may cause an arrhythmia to start. Please see the instruction sheet given to you today.  Follow-Up:  You will follow up with Dr. Lovena Le 4 weeks after your procedure.   ABLATION INSTRUCTIONS:  Please arrive at the [facility] main entrance of [facility] hospital at:  7:30 am on April 17, 2018  Do not eat or drink after midnight prior to procedure  On the morning of your procedure you may take your normal morning medications with a sip of water. MAKE SURE YOU DO NOT MISS ANY DOSES OF YOUR [place] for one night stay but you may be discharged the same day  You will need someone to drive you home at discharge  If you need a refill on your cardiac medications before your next appointment, please call your pharmacy.   Cardiac Ablation Cardiac ablation is a procedure to disable (ablate) a small amount of heart tissue in very specific places. The heart has many electrical connections. Sometimes these connections are abnormal and can cause the heart to beat very fast or irregularly. Ablating some of the problem areas can improve the heart rhythm or return it to  normal. Ablation may be done for people who:  Have Wolff-Parkinson-White syndrome.  Have fast heart rhythms (tachycardia).  Have taken medicines for an abnormal heart rhythm (arrhythmia) that were not effective or caused side effects.  Have a high-risk heartbeat that may be life-threatening.  During the procedure, a small incision is made in the neck or the groin, and a long, thin, flexible tube (catheter) is inserted into the incision and moved to the heart. Small devices (electrodes) on the tip of the catheter will send out electrical currents. A type of X-ray (fluoroscopy) will be used to help guide the catheter and to provide images of the heart. Tell a health care provider about:  Any allergies you have.  All medicines you are taking, including vitamins, herbs, eye drops, creams, and over-the-counter medicines.  Any problems you or family members have had with anesthetic medicines.  Any blood disorders you have.  Any surgeries you have had.  Any medical conditions you have, such as kidney failure.  Whether you are pregnant or may be pregnant. What are the risks? Generally, this is a safe procedure. However, problems may occur, including:  Infection.  Bruising and bleeding at the catheter insertion site.  Bleeding into the chest, especially into the sac that surrounds the heart. This is a serious complication.  Stroke or blood clots.  Damage to other structures or organs.  Allergic reaction to medicines or dyes.  Need for a permanent pacemaker if the normal electrical system is damaged. A pacemaker is a  small computer that sends electrical signals to the heart and helps your heart beat normally.  The procedure not being fully effective. This may not be recognized until months later. Repeat ablation procedures are sometimes required.  What happens before the procedure?  Follow instructions from your health care provider about eating or drinking restrictions.  Ask  your health care provider about: ? Changing or stopping your regular medicines. This is especially important if you are taking diabetes medicines or blood thinners. ? Taking medicines such as aspirin and ibuprofen. These medicines can thin your blood. Do not take these medicines before your procedure if your health care provider instructs you not to.  Plan to have someone take you home from the hospital or clinic.  If you will be going home right after the procedure, plan to have someone with you for 24 hours. What happens during the procedure?  To lower your risk of infection: ? Your health care team will wash or sanitize their hands. ? Your skin will be washed with soap. ? Hair may be removed from the incision area.  An IV tube will be inserted into one of your veins.  You will be given a medicine to help you relax (sedative).  The skin on your neck or groin will be numbed.  An incision will be made in your neck or your groin.  A needle will be inserted through the incision and into a large vein in your neck or groin.  A catheter will be inserted into the needle and moved to your heart.  Dye may be injected through the catheter to help your surgeon see the area of the heart that needs treatment.  Electrical currents will be sent from the catheter to ablate heart tissue in desired areas. There are three types of energy that may be used to ablate heart tissue: ? Heat (radiofrequency energy). ? Laser energy. ? Extreme cold (cryoablation).  When the necessary tissue has been ablated, the catheter will be removed.  Pressure will be held on the catheter insertion area to prevent excessive bleeding.  A bandage (dressing) will be placed over the catheter insertion area. The procedure may vary among health care providers and hospitals. What happens after the procedure?  Your blood pressure, heart rate, breathing rate, and blood oxygen level will be monitored until the medicines you  were given have worn off.  Your catheter insertion area will be monitored for bleeding. You will need to lie still for a few hours to ensure that you do not bleed from the catheter insertion area.  Do not drive for 24 hours or as long as directed by your health care provider. Summary  Cardiac ablation is a procedure to disable (ablate) a small amount of heart tissue in very specific places. Ablating some of the problem areas can improve the heart rhythm or return it to normal.  During the procedure, electrical currents will be sent from the catheter to ablate heart tissue in desired areas. This information is not intended to replace advice given to you by your health care provider. Make sure you discuss any questions you have with your health care provider. Document Released: 09/05/2008 Document Revised: 03/08/2016 Document Reviewed: 03/08/2016 Elsevier Interactive Patient Education  [employer].

## 2018-03-11 ENCOUNTER — Encounter: Payer: Self-pay | Admitting: Internal Medicine

## 2018-03-11 NOTE — Progress Notes (Signed)
     HPI [name] is referred today for followup. He is a pleasant 77 yo man with a h/o HTN, TIA, who was first diagnosed with atrial flutter over a year ago. It apparantly resolved spontaneously. He has had a recurrence of flutter and was placed on systemic anti-coagulation along with cardizem for rate control. He cannot feel his heart racing but notes that with any exertion, he is more sob.        Allergies  Allergen Reactions  . Amoxicillin Hives and Rash  . Shellfish Allergy Hives    Mainly Shrimp No problems with betadine on the skin   . Zantac [Ranitidine Hcl] Hives           Current Outpatient Medications  Medication Sig Dispense Refill  . acetaminophen (TYLENOL) 500 MG tablet Take 500 mg by mouth every 6 (six) hours as needed for mild pain.     . allopurinol (ZYLOPRIM) 300 MG tablet Take 300 mg by mouth at bedtime.   5  . atorvastatin (LIPITOR) 10 MG tablet Take 1 tablet (10 mg total) by mouth daily. 90 tablet 3  . docusate sodium (COLACE) 100 MG capsule Take 100 mg by mouth daily.    . finasteride (PROSCAR) 5 MG tablet Take 5 mg by mouth at bedtime.   3  . fluticasone (FLONASE) 50 MCG/ACT nasal spray Place 1 spray into both nostrils daily as needed for allergies or rhinitis.    . ibuprofen (ADVIL,MOTRIN) 200 MG tablet Take 200 mg by mouth every 6 (six) hours as needed (for pain).    . loratadine (CLARITIN) 10 MG tablet Take 10 mg by mouth daily as needed (for allergies or congestion).     . omeprazole (PRILOSEC) 20 MG capsule Take 1 capsule (20 mg total) by mouth daily. 90 capsule 3  . verapamil (CALAN-SR) 120 MG CR tablet Take 1 tablet (120 mg total) by mouth 2 (two) times daily. 180 tablet 3  . rivaroxaban (XARELTO) 20 MG TABS tablet Take 20 mg by mouth at bedtime.     No current facility-administered medications for this visit.          Past Medical History:  Diagnosis Date  . Arthritis    hands / thumbs  . Atrial flutter  (HCC)    in setting of URI now on Xarelto for a CHADS2VASC score of 4  . BPH (benign prostatic hyperplasia)   . Colon polyp   . GERD (gastroesophageal reflux disease)   . Gout   . Hypertension   . Paroxysmal supraventricular tachycardia (HCC)    Dr Turner  . Pure hypercholesterolemia   . Right bundle branch block    normal coronary arteries   . Stroke (HCC) 2009   TIA  . 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        Past Surgical History:  Procedure Laterality Date  . CARDIAC CATHETERIZATION     normal coronary arteries  . HOLEP-LASER ENUCLEATION OF THE PROSTATE WITH MORCELLATION N/A 02/07/2018   Procedure: HOLEP-LASER ENUCLEATION OF THE PROSTATE WITH MORCELLATION;  Surgeon: Sninsky, Brian C, MD;  Location: [facility] ORS;  Service: Urology;  Laterality: N/A;  . LEFT HEART CATHETERIZATION WITH CORONARY ANGIOGRAM N/A 03/20/2014   Procedure: LEFT HEART CATHETERIZATION WITH CORONARY ANGIOGRAM;  Surgeon: Christopher D McAlhany, MD;  Location: [facility] CATH LAB;  Service: Cardiovascular;  Laterality:   N/A;  . TONSILLECTOMY            Family History  Problem Relation Age of Onset  . Multiple myeloma Mother   . COPD Father   . Colon cancer Father   . Suicidality Brother   . CVA Brother      Social History        Socioeconomic History  . Marital status: Married    Spouse name: Logan Aguirre  . Number of children: Not on file  . Years of education: Not on file  . Highest education level: Not on file  Occupational History    Comment: retired  Social Needs  . Financial resource strain: Not on file  . Food insecurity:    Worry: Not on file    Inability: Not on file  . Transportation needs:    Medical: Not on file    Non-medical: Not on file  Tobacco Use  . Smoking status: Former Smoker    Types: Cigars, Cigarettes    Last attempt to quit:  1955    Years since quitting: 64.8  . Smokeless tobacco: Never Used  Substance and Sexual Activity  . Alcohol use: Yes    Alcohol/week: 8.0 standard drinks    Types: 8 Glasses of wine per week  . Drug use: No  . Sexual activity: Not on file  Lifestyle  . Physical activity:    Days per week: Not on file    Minutes per session: Not on file  . Stress: Not on file  Relationships  . Social connections:    Talks on phone: Not on file    Gets together: Not on file    Attends religious service: Not on file    Active member of club or organization: Not on file    Attends meetings of clubs or organizations: Not on file    Relationship status: Not on file  . Intimate partner violence:    Fear of current or ex partner: Not on file    Emotionally abused: Not on file    Physically abused: Not on file    Forced sexual activity: Not on file  Other Topics Concern  . Not on file  Social History Narrative   Current smoker cigar (occasional)   Alcohol -yes wine 3x a week   No caffeine    No recreational drug use   Diet yes- diet no salt ,no caffeine   Occupation principal,teacher   Martial status   Children 2     BP 120/64   Pulse 85   Ht 6' 4" (1.93 m)   Wt 206 lb (93.4 kg)   BMI 25.08 kg/m   Physical Exam:  Well appearing NAD HEENT: Unremarkable Neck:  No JVD, no thyromegally Lymphatics:  No adenopathy Back:  No CVA tenderness Lungs:  Clear with no wheezes HEART:  Regular rate rhythm, no murmurs, no rubs, no clicks Abd:  soft, positive bowel sounds, no organomegally, no rebound, no guarding Ext:  2 plus pulses, no edema, no cyanosis, no clubbing Skin:  No rashes no nodules Neuro:  CN II through XII intact, motor grossly intact  EKG - atrial flutter with a controlled VR  Assess/Plan: 1. Atrial flutter - his VR is controlled. I have discussed the treatment options with the patient and his wife.  2. Dyslipidemia - he will  continue his current meds.  3. Coags - he will continue his Xarelto. We discussed that he will need 3 weeks of anti-coagulation or 3 days

## 2018-03-13 ENCOUNTER — Telehealth: Payer: Self-pay | Admitting: Urology

## 2018-03-13 NOTE — Telephone Encounter (Signed)
Pt lmom stating that he was advised to call Dr. Diamantina Providence once he had an appt with the " a fib " doctor, Please advise pt at [phone]

## 2018-03-23 ENCOUNTER — Other Ambulatory Visit: Payer: Self-pay

## 2018-03-23 ENCOUNTER — Encounter: Payer: Self-pay | Admitting: Urology

## 2018-03-23 ENCOUNTER — Ambulatory Visit (INDEPENDENT_AMBULATORY_CARE_PROVIDER_SITE_OTHER): Payer: Medicare Other | Admitting: Urology

## 2018-03-23 VITALS — BP 110/66 | Ht 76.0 in | Wt 213.0 lb

## 2018-03-23 DIAGNOSIS — N401 Enlarged prostate with lower urinary tract symptoms: Secondary | ICD-10-CM

## 2018-03-23 DIAGNOSIS — R3914 Feeling of incomplete bladder emptying: Secondary | ICD-10-CM

## 2018-03-23 LAB — BLADDER SCAN AMB NON-IMAGING: SCAN RESULT: 31

## 2018-03-23 NOTE — Progress Notes (Signed)
   03/23/2018 11:29 AM   [name] 1940/08/13 [id number]  Reason for visit: Follow up s/p HOLEP  HPI: I saw Mr. Stork back in urology clinic after HOLEP.  He is a 77 year old male with atrial fibrillation on Xarelto who underwent HOLEP 150 g prostate on 02/07/2018.  He did have some issues with hematuria postoperatively when he resumed his Xarelto, however that has since resolved.  He continues to be on Xarelto and is voiding yellow urine with a strong stream.  He is very happy with his urinary symptoms at this point.  He denies any significant urge or stress incontinence at this point.  PVR in clinic today was 30 cc.  Of note, he is planning to undergo an ablation with the goal of being able to stop Xarelto in the future.  ROS: Please see flowsheet from today's date for complete review of systems.  Physical Exam: BP 110/66   Ht 6\' 4"  (1.93 m)   Wt 213 lb (96.6 kg)   BMI 25.93 kg/m    Assessment & Plan:   In summary, Mr. Sunday is a 76 year old male status post HOLEP for 150 g prostate on 02/07/2018.  He is doing very well with excellent stream and almost complete symptom resolution at this point.  Follow-up in 1 year with PVR, likely can follow-up as needed after that if doing well at that visit  A total of 10 minutes were spent face-to-face with the patient today, greater than 50% were spent in counseling and coronation of care regarding BPH and postop expectations.  Billey Co, [facility] [address], [place], [place] [address] [phone]

## 2018-04-14 ENCOUNTER — Other Ambulatory Visit: Payer: Medicare Other | Admitting: *Deleted

## 2018-04-14 DIAGNOSIS — I483 Typical atrial flutter: Secondary | ICD-10-CM

## 2018-04-14 LAB — CBC WITH DIFFERENTIAL/PLATELET
BASOS ABS: 0.1 10*3/uL (ref 0.0–0.2)
Basos: 1 %
EOS (ABSOLUTE): 0.1 10*3/uL (ref 0.0–0.4)
EOS: 2 %
HEMATOCRIT: 38.2 % (ref 37.5–51.0)
Hemoglobin: 12.6 g/dL — ABNORMAL LOW (ref 13.0–17.7)
IMMATURE GRANULOCYTES: 0 %
Immature Grans (Abs): 0 10*3/uL (ref 0.0–0.1)
LYMPHS ABS: 2.5 10*3/uL (ref 0.7–3.1)
Lymphs: 35 %
MCH: 28.2 pg (ref 26.6–33.0)
MCHC: 33 g/dL (ref 31.5–35.7)
MCV: 86 fL (ref 79–97)
Monocytes Absolute: 0.4 10*3/uL (ref 0.1–0.9)
Monocytes: 6 %
NEUTROS PCT: 56 %
Neutrophils Absolute: 4 10*3/uL (ref 1.4–7.0)
Platelets: 249 10*3/uL (ref 150–450)
RBC: 4.47 x10E6/uL (ref 4.14–5.80)
RDW: 12.8 % (ref 12.3–15.4)
WBC: 7.1 10*3/uL (ref 3.4–10.8)

## 2018-04-14 LAB — BASIC METABOLIC PANEL
BUN/Creatinine Ratio: 20 (ref 10–24)
BUN: 21 mg/dL (ref 8–27)
CHLORIDE: 105 mmol/L (ref 96–106)
CO2: 22 mmol/L (ref 20–29)
Calcium: 8.7 mg/dL (ref 8.6–10.2)
Creatinine, Ser: 1.05 mg/dL (ref 0.76–1.27)
GFR calc Af Amer: 78 mL/min/{1.73_m2} (ref >59)
GFR calc non Af Amer: 67 mL/min/{1.73_m2} (ref >59)
GLUCOSE: 110 mg/dL — AB (ref 65–99)
POTASSIUM: 4.2 mmol/L (ref 3.5–5.2)
SODIUM: 142 mmol/L (ref 134–144)

## 2018-04-17 ENCOUNTER — Other Ambulatory Visit: Payer: Self-pay

## 2018-04-17 ENCOUNTER — Encounter (HOSPITAL_COMMUNITY)
Admission: RE | Disposition: A | Discharge status: Home / Self Care | Payer: Self-pay | Source: Home / Self Care | Attending: Internal Medicine

## 2018-04-17 ENCOUNTER — Ambulatory Visit (HOSPITAL_COMMUNITY)
Admission: RE | Admit: 2018-04-17 | Discharge: 2018-04-17 | Disposition: A | Discharge status: Home / Self Care | Payer: Medicare Other | Attending: Internal Medicine | Admitting: Internal Medicine

## 2018-04-17 DIAGNOSIS — Z955 Presence of coronary angioplasty implant and graft: Secondary | ICD-10-CM | POA: Insufficient documentation

## 2018-04-17 DIAGNOSIS — I4510 Unspecified right bundle-branch block: Secondary | ICD-10-CM | POA: Insufficient documentation

## 2018-04-17 DIAGNOSIS — I483 Typical atrial flutter: Secondary | ICD-10-CM | POA: Diagnosis not present

## 2018-04-17 DIAGNOSIS — I4892 Unspecified atrial flutter: Secondary | ICD-10-CM | POA: Diagnosis present

## 2018-04-17 DIAGNOSIS — Z79899 Other long term (current) drug therapy: Secondary | ICD-10-CM | POA: Diagnosis not present

## 2018-04-17 DIAGNOSIS — Z7901 Long term (current) use of anticoagulants: Secondary | ICD-10-CM | POA: Insufficient documentation

## 2018-04-17 DIAGNOSIS — Z8673 Personal history of transient ischemic attack (TIA), and cerebral infarction without residual deficits: Secondary | ICD-10-CM | POA: Insufficient documentation

## 2018-04-17 DIAGNOSIS — E7800 Pure hypercholesterolemia, unspecified: Secondary | ICD-10-CM | POA: Insufficient documentation

## 2018-04-17 DIAGNOSIS — Z888 Allergy status to other drugs, medicaments and biological substances status: Secondary | ICD-10-CM | POA: Diagnosis not present

## 2018-04-17 DIAGNOSIS — M109 Gout, unspecified: Secondary | ICD-10-CM | POA: Diagnosis not present

## 2018-04-17 DIAGNOSIS — Z91013 Allergy to seafood: Secondary | ICD-10-CM | POA: Diagnosis not present

## 2018-04-17 DIAGNOSIS — I10 Essential (primary) hypertension: Secondary | ICD-10-CM | POA: Insufficient documentation

## 2018-04-17 DIAGNOSIS — M1990 Unspecified osteoarthritis, unspecified site: Secondary | ICD-10-CM | POA: Diagnosis not present

## 2018-04-17 DIAGNOSIS — Z87891 Personal history of nicotine dependence: Secondary | ICD-10-CM | POA: Insufficient documentation

## 2018-04-17 DIAGNOSIS — Z823 Family history of stroke: Secondary | ICD-10-CM | POA: Insufficient documentation

## 2018-04-17 DIAGNOSIS — Z880 Allergy status to penicillin: Secondary | ICD-10-CM | POA: Insufficient documentation

## 2018-04-17 DIAGNOSIS — K219 Gastro-esophageal reflux disease without esophagitis: Secondary | ICD-10-CM | POA: Insufficient documentation

## 2018-04-17 DIAGNOSIS — N400 Enlarged prostate without lower urinary tract symptoms: Secondary | ICD-10-CM | POA: Insufficient documentation

## 2018-04-17 HISTORY — PX: A-FLUTTER ABLATION: EP1230

## 2018-04-17 SURGERY — A-FLUTTER ABLATION

## 2018-04-17 MED ORDER — SODIUM CHLORIDE 0.9 % IV SOLN
INTRAVENOUS | Status: DC
  Administered 2018-04-17: 09:00:00 via INTRAVENOUS

## 2018-04-17 MED ORDER — MIDAZOLAM HCL 5 MG/5ML IJ SOLN
INTRAMUSCULAR | Status: DC | PRN
  Administered 2018-04-17 (×12): 1 mg via INTRAVENOUS

## 2018-04-17 MED ORDER — FENTANYL CITRATE (PF) 100 MCG/2ML IJ SOLN
INTRAMUSCULAR | Status: AC
  Filled 2018-04-17: qty 2

## 2018-04-17 MED ORDER — BUPIVACAINE HCL (PF) 0.25 % IJ SOLN
INTRAMUSCULAR | Status: DC | PRN
  Administered 2018-04-17: 60 mL

## 2018-04-17 MED ORDER — BUPIVACAINE HCL (PF) 0.25 % IJ SOLN
INTRAMUSCULAR | Status: AC
  Filled 2018-04-17: qty 60

## 2018-04-17 MED ORDER — SODIUM CHLORIDE 0.9% FLUSH: 3.0000 mL | Freq: Two times a day (BID) | INTRAVENOUS | Status: DC

## 2018-04-17 MED ORDER — ONDANSETRON HCL 4 MG/2ML IJ SOLN: 4.0000 mg | Freq: Four times a day (QID) | INTRAMUSCULAR | Status: DC | PRN

## 2018-04-17 MED ORDER — ACETAMINOPHEN 325 MG PO TABS: 650.0000 mg | ORAL_TABLET | ORAL | Status: DC | PRN

## 2018-04-17 MED ORDER — SODIUM CHLORIDE 0.9 % IV SOLN: 250.0000 mL | INTRAVENOUS | Status: DC | PRN

## 2018-04-17 MED ORDER — MIDAZOLAM HCL 5 MG/5ML IJ SOLN
INTRAMUSCULAR | Status: AC
  Filled 2018-04-17: qty 5

## 2018-04-17 MED ORDER — HEPARIN SODIUM (PORCINE) 1000 UNIT/ML IJ SOLN
INTRAMUSCULAR | Status: DC | PRN
  Administered 2018-04-17: 1000 [IU] via INTRAVENOUS

## 2018-04-17 MED ORDER — HEPARIN SODIUM (PORCINE) 1000 UNIT/ML IJ SOLN
INTRAMUSCULAR | Status: AC
  Filled 2018-04-17: qty 1

## 2018-04-17 MED ORDER — HEPARIN (PORCINE) IN NACL 1000-0.9 UT/500ML-% IV SOLN
INTRAVENOUS | Status: AC
  Filled 2018-04-17: qty 500

## 2018-04-17 MED ORDER — FENTANYL CITRATE (PF) 100 MCG/2ML IJ SOLN
INTRAMUSCULAR | Status: DC | PRN
  Administered 2018-04-17 (×5): 12.5 ug via INTRAVENOUS
  Administered 2018-04-17: 25 ug via INTRAVENOUS
  Administered 2018-04-17 (×5): 12.5 ug via INTRAVENOUS

## 2018-04-17 MED ORDER — HEPARIN (PORCINE) IN NACL 1000-0.9 UT/500ML-% IV SOLN
INTRAVENOUS | Status: DC | PRN
  Administered 2018-04-17: 500 mL

## 2018-04-17 MED ORDER — SODIUM CHLORIDE 0.9% FLUSH: 3.0000 mL | INTRAVENOUS | Status: DC | PRN

## 2018-04-17 SURGICAL SUPPLY — 9 items
CATH SMTCH THERMOCOOL SF FJ (CATHETERS) ×3 IMPLANT
CATH WEBSTER BI DIR CS D-F CRV (CATHETERS) ×2 IMPLANT
PACK EP LATEX FREE (CUSTOM PROCEDURE TRAY) ×3
PACK EP LF (CUSTOM PROCEDURE TRAY) ×1 IMPLANT
PAD PRO RADIOLUCENT 2001M-C (PAD) ×3 IMPLANT
PATCH CARTO3 (PAD) ×2 IMPLANT
SHEATH PINNACLE 6F 10CM (SHEATH) ×3 IMPLANT
SHEATH PINNACLE 8F 10CM (SHEATH) ×4 IMPLANT
TUBING SMART ABLATE COOLFLOW (TUBING) ×2 IMPLANT

## 2018-04-17 NOTE — H&P (Signed)
HPI Logan Aguirre is referred today for followup. He is a pleasant 78 yo man with a h/o HTN, TIA, who was first diagnosed with atrial flutter over a year ago. It apparantly resolved spontaneously. He has had a recurrence of flutter and was placed on systemic anti-coagulation along with cardizem for rate control. He cannot feel his heart racing but notes that with any exertion, he is more sob.        Allergies  Allergen Reactions  . Amoxicillin Hives and Rash  . Shellfish Allergy Hives    Mainly Shrimp No problems with betadine on the skin   . Zantac [Ranitidine Hcl] Hives           Current Outpatient Medications  Medication Sig Dispense Refill  . acetaminophen (TYLENOL) 500 MG tablet Take 500 mg by mouth every 6 (six) hours as needed for mild pain.     Marland Kitchen allopurinol (ZYLOPRIM) 300 MG tablet Take 300 mg by mouth at bedtime.   5  . atorvastatin (LIPITOR) 10 MG tablet Take 1 tablet (10 mg total) by mouth daily. 90 tablet 3  . docusate sodium (COLACE) 100 MG capsule Take 100 mg by mouth daily.    . finasteride (PROSCAR) 5 MG tablet Take 5 mg by mouth at bedtime.   3  . fluticasone (FLONASE) 50 MCG/ACT nasal spray Place 1 spray into both nostrils daily as needed for allergies or rhinitis.    Marland Kitchen ibuprofen (ADVIL,MOTRIN) 200 MG tablet Take 200 mg by mouth every 6 (six) hours as needed (for pain).    Marland Kitchen loratadine (CLARITIN) 10 MG tablet Take 10 mg by mouth daily as needed (for allergies or congestion).     Marland Kitchen omeprazole (PRILOSEC) 20 MG capsule Take 1 capsule (20 mg total) by mouth daily. 90 capsule 3  . verapamil (CALAN-SR) 120 MG CR tablet Take 1 tablet (120 mg total) by mouth 2 (two) times daily. 180 tablet 3  . rivaroxaban (XARELTO) 20 MG TABS tablet Take 20 mg by mouth at bedtime.     No current facility-administered medications for this visit.          Past Medical History:  Diagnosis Date  . Arthritis    hands / thumbs  . Atrial flutter  ([place])    in setting of URI now on Xarelto for a CHADS2VASC score of 4  . BPH (benign prostatic hyperplasia)   . Colon polyp   . GERD (gastroesophageal reflux disease)   . Gout   . Hypertension   . Paroxysmal supraventricular tachycardia ([place])    Dr Radford Pax  . Pure hypercholesterolemia   . Right bundle branch block    normal coronary arteries   . Stroke [facility]) 2009   TIA  . 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        Past Surgical History:  Procedure Laterality Date  . CARDIAC CATHETERIZATION     normal coronary arteries  . HOLEP-LASER ENUCLEATION OF THE PROSTATE WITH MORCELLATION N/A 02/07/2018   Procedure: HOLEP-LASER ENUCLEATION OF THE PROSTATE WITH MORCELLATION;  Surgeon: Billey Co, MD;  Location: [facility] ORS;  Service: Urology;  Laterality: N/A;  . LEFT HEART CATHETERIZATION WITH CORONARY ANGIOGRAM N/A 03/20/2014   Procedure: LEFT HEART CATHETERIZATION WITH CORONARY ANGIOGRAM;  Surgeon: Burnell Blanks, MD;  Location: [facility] CATH LAB;  Service: Cardiovascular;  Laterality:  N/A;  . TONSILLECTOMY            Family History  Problem Relation Age of Onset  . Multiple myeloma Mother   . COPD Father   . Colon cancer Father   . Suicidality Brother   . CVA Brother      Social History        Socioeconomic History  . Marital status: Married    Spouse name: [name], retail  . Number of children: Not on file  . Years of education: Not on file  . Highest education level: Not on file  Occupational History    Comment: retired  Scientific laboratory technician  . Financial resource strain: Not on file  . Food insecurity:    Worry: Not on file    Inability: Not on file  . Transportation needs:    Medical: Not on file    Non-medical: Not on file  Tobacco Use  . Smoking status: Former Smoker    Types: Cigars, Cigarettes    Last attempt to quit:  1955    Years since quitting: 64.8  . Smokeless tobacco: Never Used  Substance and Sexual Activity  . Alcohol use: Yes    Alcohol/week: 8.0 standard drinks    Types: 8 Glasses of wine per week  . Drug use: No  . Sexual activity: Not on file  Lifestyle  . Physical activity:    Days per week: Not on file    Minutes per session: Not on file  . Stress: Not on file  Relationships  . Social connections:    Talks on phone: Not on file    Gets together: Not on file    Attends religious service: Not on file    Active member of club or organization: Not on file    Attends meetings of clubs or organizations: Not on file    Relationship status: Not on file  . Intimate partner violence:    Fear of current or ex partner: Not on file    Emotionally abused: Not on file    Physically abused: Not on file    Forced sexual activity: Not on file  Other Topics Concern  . Not on file  Social History Narrative   Current smoker cigar (occasional)   Alcohol -yes wine 3x a week   No caffeine    No recreational drug use   Diet yes- diet no salt ,no caffeine   Occupation principal,teacher   Martial status   Children 2     BP 120/64   Pulse 85   Ht _0  (1.93 m)   Wt 206 lb (93.4 kg)   BMI 25.08 kg/m   Physical Exam:  Well appearing NAD HEENT: Unremarkable Neck:  No JVD, no thyromegally Lymphatics:  No adenopathy Back:  No CVA tenderness Lungs:  Clear with no wheezes HEART:  Regular rate rhythm, no murmurs, no rubs, no clicks Abd:  soft, positive bowel sounds, no organomegally, no rebound, no guarding Ext:  2 plus pulses, no edema, no cyanosis, no clubbing Skin:  No rashes no nodules Neuro:  CN II through XII intact, motor grossly intact  EKG - atrial flutter with a controlled VR  Assess/Plan: 1. Atrial flutter - his VR is controlled. I have discussed the treatment options with the patient and his wife.  2. Dyslipidemia - he will  continue his current meds.  3. Coags - he will continue his Xarelto. We discussed that he will need 3 weeks of anti-coagulation or 3 days  of a TEE before his ablation. He will call us if he would like to schedule this.   [name].D.

## 2018-04-17 NOTE — Progress Notes (Signed)
EP Attending  He remains a little sleepy. No chest pain. Vitals are stable. His groin demonstrates no hematoma. Tele with NSR Plan - he is stable for DC home later this evening after bed rest is complete and he has ambulated in the halls and groin ok.  Logan Aguirre.D.

## 2018-04-17 NOTE — Progress Notes (Signed)
rfv site is unremarkable.; rsr 72 on monitor; L hand iv site s/l - no redness or edema to site.

## 2018-04-17 NOTE — Progress Notes (Signed)
8 fr (x2), 6 fr x1 removed  With all sheath tips intactfrom rfv. Manual pressure held x 20 min with hemostasis easily achieved. A dry sterile dressing applied to rfv. R dp +1 palp. Pt .advised if he laughs, coughs ,sneezes or bears down to hold pressure to rfv site. Pt. Verbally repeats instructions back to me. Pt remains rsr 70s on the moniotr bp 111/62.  stable

## 2018-04-17 NOTE — Discharge Instructions (Signed)
Post procedure care instructions No driving for 4 days. No lifting over 5 lbs for 1 week. No vigorous or sexual activity for 1 week. You may return to work on 04/24/18. Keep procedure site clean & dry. If you notice increased pain, swelling, bleeding or pus, call/return!  You may shower, but no soaking baths/hot tubs/pools for 1 week.    Femoral Site Care Refer to this sheet in the next few weeks. These instructions provide you with information about caring for yourself after your procedure. Your health care provider may also give you more specific instructions. Your treatment has been planned according to current medical practices, but problems sometimes occur. Call your health care provider if you have any problems or questions after your procedure. What can I expect after the procedure? After your procedure, it is typical to have the following:  Bruising at the site that usually fades within 1-2 weeks.  Blood collecting in the tissue (hematoma) that may be painful to the touch. It should usually decrease in size and tenderness within 1-2 weeks.  Follow these instructions at home:  Take medicines only as directed by your health care provider.  You may shower 24-48 hours after the procedure or as directed by your health care provider. Remove the bandage (dressing) and gently wash the site with plain soap and water. Pat the area dry with a clean towel. Do not rub the site, because this may cause bleeding.  Do not take baths, swim, or use a hot tub until your health care provider approves.  Check your insertion site every day for redness, swelling, or drainage.  Do not apply powder or lotion to the site.  Limit use of stairs to twice a day for the first 2-3 days or as directed by your health care provider.  Do not squat for the first 2-3 days or as directed by your health care provider.  Do not lift over 10 lb (4.5 kg) for 5 days after your procedure or as directed by your health care  provider.  Ask your health care provider when it is okay to: ? Return to work or school. ? Resume usual physical activities or sports. ? Resume sexual activity.  Do not drive home if you are discharged the same day as the procedure. Have someone else drive you.  You may drive 24 hours after the procedure unless otherwise instructed by your health care provider.  Do not operate machinery or power tools for 24 hours after the procedure or as directed by your health care provider.  If your procedure was done as an outpatient procedure, which means that you went home the same day as your procedure, a responsible adult should be with you for the first 24 hours after you arrive home.  Keep all follow-up visits as directed by your health care provider. This is important. Contact a health care provider if:  You have a fever.  You have chills.  You have increased bleeding from the site. Hold pressure on the site. Get help right away if:  You have unusual pain at the site.  You have redness, warmth, or swelling at the site.  You have drainage (other than a small amount of blood on the dressing) from the site.  The site is bleeding, and the bleeding does not stop after 30 minutes of holding steady pressure on the site.  Your leg or foot becomes pale, cool, tingly, or numb. This information is not intended to replace advice given to you  by your health care provider. Make sure you discuss any questions you have with your health care provider. Document Released: 12/21/2013 Document Revised: 09/25/2015 Document Reviewed: 11/06/2013 Elsevier Interactive Patient Education  [employer].

## 2018-04-17 NOTE — Progress Notes (Signed)
After ambulating in hallway, RN checked groin and blood noted on gauze.  RN removed dressing and minimal oozing noted.  RN held pressure x5 minutes.  No hematoma and hemostasis achieved.  No complaints of pain from patient. RN applied new dressing to site.

## 2018-04-18 ENCOUNTER — Encounter (HOSPITAL_COMMUNITY): Payer: Self-pay | Admitting: Internal Medicine

## 2018-04-21 ENCOUNTER — Telehealth: Payer: Self-pay | Admitting: *Deleted

## 2018-04-21 NOTE — Telephone Encounter (Signed)
Per device clinic move PM appt to AM. lmtcb [phone].

## 2018-04-21 NOTE — Telephone Encounter (Signed)
Patient came to the office requesting two weeks of xarelto samples. Samples provided to the patient.

## 2018-05-09 ENCOUNTER — Ambulatory Visit (INDEPENDENT_AMBULATORY_CARE_PROVIDER_SITE_OTHER): Payer: Medicare Other | Admitting: Podiatry

## 2018-05-09 ENCOUNTER — Encounter: Payer: Self-pay | Admitting: Podiatry

## 2018-05-09 VITALS — BP 121/71 | HR 68 | Temp 98.0°F | Resp 16 | Ht 76.0 in | Wt 210.0 lb

## 2018-05-09 DIAGNOSIS — L600 Ingrowing nail: Secondary | ICD-10-CM | POA: Diagnosis not present

## 2018-05-09 NOTE — Progress Notes (Signed)
Subjective:    Patient ID: CADYN FANN, male    DOB: 08-27-1940, 78 y.o.   MRN: [id number]  HPI 78 year old male presents the office today for concerns of right big toenail pain, lateral aspect which is been ongoing for the last 2 months.  He describes a sharp, shooting pain to the area.  He does get some bloody drainage coming from the nail corner but denies any pus.  He does note some swelling and redness on the nail corner.  Tight shoes make the pain worse.  He has been keeping a Band-Aid on the area.  He had a toenail injury 2018 as well that he reports.  He has no other concerns today.   Review of Systems  All other systems reviewed and are negative.  Past Medical History:  Diagnosis Date  . Arthritis    hands / thumbs  . Atrial flutter ([place])    in setting of URI now on Xarelto for a CHADS2VASC score of 4  . BPH (benign prostatic hyperplasia)   . Colon polyp   . GERD (gastroesophageal reflux disease)   . Gout   . Hypertension   . Paroxysmal supraventricular tachycardia ([place])    Dr Radford Pax  . Pure hypercholesterolemia   . Right bundle branch block    normal coronary arteries   . Stroke [facility]) 2009   TIA  . 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 A-FLUTTER ABLATION N/A 04/17/2018   Procedure: A-FLUTTER ABLATION;  Surgeon: Evans Lance, MD;  Location: [place] CV LAB;  Service: Cardiovascular;  Laterality: N/A;  . CARDIAC CATHETERIZATION     normal coronary arteries  . HOLEP-LASER ENUCLEATION OF THE PROSTATE WITH MORCELLATION N/A 02/07/2018   Procedure: HOLEP-LASER ENUCLEATION OF THE PROSTATE WITH MORCELLATION;  Surgeon: Billey Co, MD;  Location: [facility] ORS;  Service: Urology;  Laterality: N/A;  . LEFT HEART CATHETERIZATION WITH CORONARY ANGIOGRAM N/A 03/20/2014   Procedure: LEFT HEART CATHETERIZATION WITH CORONARY ANGIOGRAM;  Surgeon: Burnell Blanks, MD;  Location: [facility] CATH LAB;   Service: Cardiovascular;  Laterality: N/A;  . TONSILLECTOMY       Current Outpatient Medications:  .  allopurinol (ZYLOPRIM) 300 MG tablet, Take 300 mg by mouth at bedtime. , Disp: , Rfl: 5 .  atorvastatin (LIPITOR) 10 MG tablet, Take 1 tablet (10 mg total) by mouth daily. (Patient taking differently: Take 10 mg by mouth at bedtime. ), Disp: 90 tablet, Rfl: 3 .  docusate sodium (COLACE) 100 MG capsule, Take 100 mg by mouth every evening. , Disp: , Rfl:  .  finasteride (PROSCAR) 5 MG tablet, Take 5 mg by mouth at bedtime. , Disp: , Rfl: 3 .  ibuprofen (ADVIL,MOTRIN) 200 MG tablet, Take 200 mg by mouth every 6 (six) hours as needed for moderate pain (for pain). , Disp: , Rfl:  .  loratadine (CLARITIN) 10 MG tablet, Take 10 mg by mouth daily as needed (for allergies or congestion). , Disp: , Rfl:  .  omeprazole (PRILOSEC) 20 MG capsule, Take 1 capsule (20 mg total) by mouth daily. (Patient taking differently: Take 20 mg by mouth every evening. ), Disp: 90 capsule, Rfl: 3 .  verapamil (CALAN-SR) 120 MG CR tablet, Take 1 tablet (120 mg total) by mouth 2 (two) times daily., Disp: 180 tablet, Rfl: 3  Allergies  Allergen Reactions  . Amoxicillin Hives and Rash  Has patient had a PCN reaction causing immediate rash, facial/tongue/throat swelling, SOB or lightheadedness with hypotension: Yes Has patient had a PCN reaction causing severe rash involving mucus membranes or skin necrosis: Yes Has patient had a PCN reaction that required hospitalization: No Has patient had a PCN reaction occurring within the last 10 years: No If all of the above answers are "NO", then may proceed with Cephalosporin use.   . Shrimp [Shellfish Allergy] Hives  . Zantac [Ranitidine Hcl] Hives        Objective:   Physical Exam  General: AAO x3, NAD  Dermatological: Patient presents the lateral aspect the right hallux toenail with localized edema and faint erythema to the area.  There is no ascending cellulitis.   There is no fluctuation or crepitation.  There is no malodor.  No open lesions identified.  Vascular: Dorsalis Pedis artery and Posterior Tibial artery pedal pulses are 2/4 bilateral with immedate capillary fill time. There is no pain with calf compression, swelling, warmth, erythema.   Neruologic: Grossly intact via light touch bilateral. Protective threshold with Semmes Wienstein monofilament intact to all pedal sites bilateral.   Musculoskeletal: No gross boney pedal deformities bilateral. No pain, crepitus, or limitation noted with foot and ankle range of motion bilateral. Muscular strength 5/5 in all groups tested bilateral.     Assessment & Plan:  78 year old male with right lateral hallux symptomatic ingrown toenail -Treatment options discussed including all alternatives, risks, and complications -Etiology of symptoms were discussed -At this time, the patient is requesting partial nail removal with chemical matricectomy to the symptomatic portion of the nail. Risks and complications were discussed with the patient for which they understand and written consent was obtained. Under sterile conditions a total of 3 mL of a mixture of 2% lidocaine plain and 0.5% Marcaine plain was infiltrated in a hallux block fashion. Once anesthetized, the skin was prepped in sterile fashion. A tourniquet was then applied. Next the lateral aspect of hallux nail border was then sharply excised making sure to remove the entire offending nail border. Once the nails were ensured to be removed area was debrided and the underlying skin was intact. There is no purulence identified in the procedure. Next phenol was then applied under standard conditions and copiously irrigated. Silvadene was applied. A dry sterile dressing was applied. After application of the dressing the tourniquet was removed and there is found to be an immediate capillary refill time to the digit. The patient tolerated the procedure well any  complications. Post procedure instructions were discussed the patient for which he verbally understood. Follow-up in one week for nail check or sooner if any problems are to arise. Discussed signs/symptoms of infection and directed to call the office immediately should any occur or go directly to the emergency room. In the meantime, encouraged to call the office with any questions, concerns, changes symptoms.  Trula Slade DPM

## 2018-05-09 NOTE — Patient Instructions (Signed)

## 2018-05-10 ENCOUNTER — Encounter: Payer: Self-pay | Admitting: Internal Medicine

## 2018-05-10 ENCOUNTER — Ambulatory Visit: Payer: Medicare Other | Admitting: Internal Medicine

## 2018-05-10 ENCOUNTER — Ambulatory Visit (INDEPENDENT_AMBULATORY_CARE_PROVIDER_SITE_OTHER): Payer: Medicare Other | Admitting: Internal Medicine

## 2018-05-10 VITALS — BP 132/62 | HR 83 | Ht 76.0 in | Wt 209.0 lb

## 2018-05-10 DIAGNOSIS — I483 Typical atrial flutter: Secondary | ICD-10-CM

## 2018-05-10 NOTE — Patient Instructions (Signed)
Medication Instructions:  Your physician recommends that you continue on your current medications as directed. Please refer to the Current Medication list given to you today.  Labwork: None ordered.  Testing/Procedures: None ordered.  Follow-Up: Your physician wants you to follow-up in: as needed.   Any Other Special Instructions Will Be Listed Below (If Applicable).  If you need a refill on your cardiac medications before your next appointment, please call your pharmacy.    

## 2018-05-10 NOTE — Progress Notes (Signed)
HPI [name] returns today for followup of atrial flutter. He is a pleasant 78 yo man with peristent atrial flutter, s/p catheter ablation. He has done well in the interim. He has had no chest pain, sob, or palpitations. He is still taking his xarelto. Allergies  Allergen Reactions  . Amoxicillin Hives and Rash    Has patient had a PCN reaction causing immediate rash, facial/tongue/throat swelling, SOB or lightheadedness with hypotension: Yes Has patient had a PCN reaction causing severe rash involving mucus membranes or skin necrosis: Yes Has patient had a PCN reaction that required hospitalization: No Has patient had a PCN reaction occurring within the last 10 years: No If all of the above answers are "NO", then may proceed with Cephalosporin use.   . Shrimp [Shellfish Allergy] Hives  . Zantac [Ranitidine Hcl] Hives     Current Outpatient Medications  Medication Sig Dispense Refill  . allopurinol (ZYLOPRIM) 300 MG tablet Take 300 mg by mouth at bedtime.   5  . atorvastatin (LIPITOR) 10 MG tablet Take 1 tablet (10 mg total) by mouth daily. (Patient taking differently: Take 10 mg by mouth at bedtime. ) 90 tablet 3  . docusate sodium (COLACE) 100 MG capsule Take 100 mg by mouth every evening.     . finasteride (PROSCAR) 5 MG tablet Take 5 mg by mouth at bedtime.   3  . ibuprofen (ADVIL,MOTRIN) 200 MG tablet Take 200 mg by mouth every 6 (six) hours as needed for moderate pain (for pain).     Marland Kitchen loratadine (CLARITIN) 10 MG tablet Take 10 mg by mouth daily as needed (for allergies or congestion).     Marland Kitchen omeprazole (PRILOSEC) 20 MG capsule Take 1 capsule (20 mg total) by mouth daily. (Patient taking differently: Take 20 mg by mouth every evening. ) 90 capsule 3  . verapamil (CALAN-SR) 120 MG CR tablet Take 1 tablet (120 mg total) by mouth 2 (two) times daily. 180 tablet 3   No current facility-administered medications for this visit.      Past Medical History:  Diagnosis Date    . Arthritis    hands / thumbs  . Atrial flutter ([place])    in setting of URI now on Xarelto for a CHADS2VASC score of 4  . BPH (benign prostatic hyperplasia)   . Colon polyp   . GERD (gastroesophageal reflux disease)   . Gout   . Hypertension   . Paroxysmal supraventricular tachycardia ([place])    Dr Radford Pax  . Pure hypercholesterolemia   . Right bundle branch block    normal coronary arteries   . Stroke [facility]) 2009   TIA  . 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   Past Surgical History:  Procedure Laterality Date  . A-FLUTTER ABLATION N/A 04/17/2018   Procedure: A-FLUTTER ABLATION;  Surgeon: Evans Lance, MD;  Location: [place] CV LAB;  Service: Cardiovascular;  Laterality: N/A;  . CARDIAC CATHETERIZATION     normal coronary arteries  . HOLEP-LASER ENUCLEATION OF THE PROSTATE WITH MORCELLATION N/A 02/07/2018   Procedure: HOLEP-LASER ENUCLEATION OF THE PROSTATE WITH MORCELLATION;  Surgeon: Billey Co, MD;  Location: [facility] ORS;  Service: Urology;  Laterality: N/A;  . LEFT HEART CATHETERIZATION WITH CORONARY ANGIOGRAM N/A 03/20/2014   Procedure: LEFT HEART CATHETERIZATION WITH CORONARY ANGIOGRAM;  Surgeon: Burnell Blanks, MD;  Location: [place] CATH LAB;  Service: Cardiovascular;  Laterality: N/A;  . TONSILLECTOMY       Family History  Problem Relation Age of Onset  . Multiple myeloma Mother   . COPD Father   . Colon cancer Father   . Suicidality Brother   . CVA Brother      Social History   Socioeconomic History  . Marital status: Married    Spouse name: [name], retail  . Number of children: Not on file  . Years of education: Not on file  . Highest education level: Not on file  Occupational History    Comment: retired  Scientific laboratory technician  . Financial resource strain: Not on file  . Food insecurity:    Worry: Not on file    Inability: Not on file  . Transportation needs:     Medical: Not on file    Non-medical: Not on file  Tobacco Use  . Smoking status: Former Smoker    Types: Cigars, Cigarettes    Last attempt to quit: 1955    Years since quitting: 65.0  . Smokeless tobacco: Never Used  Substance and Sexual Activity  . Alcohol use: Yes    Alcohol/week: 8.0 standard drinks    Types: 8 Glasses of wine per week  . Drug use: No  . Sexual activity: Not on file  Lifestyle  . Physical activity:    Days per week: Not on file    Minutes per session: Not on file  . Stress: Not on file  Relationships  . Social connections:    Talks on phone: Not on file    Gets together: Not on file    Attends religious service: Not on file    Active member of club or organization: Not on file    Attends meetings of clubs or organizations: Not on file    Relationship status: Not on file  . Intimate partner violence:    Fear of current or ex partner: Not on file    Emotionally abused: Not on file    Physically abused: Not on file    Forced sexual activity: Not on file  Other Topics Concern  . Not on file  Social History Narrative   Current smoker cigar (occasional)   Alcohol -yes wine 3x a week   No caffeine    No recreational drug use   Diet yes- diet no salt ,no caffeine   Occupation principal,teacher   Martial status   Children 2     BP 132/62   Pulse 83   Ht _0  (1.93 m)   Wt 209 lb (94.8 kg)   BMI 25.44 kg/m   Physical Exam:  Well appearing 78 yo man, NAD HEENT: Unremarkable Neck:  6 cm JVD, no thyromegally Lymphatics:  No adenopathy Back:  No CVA tenderness Lungs:  Clear with no wheezes HEART:  Regular rate rhythm, no murmurs, no rubs, no clicks Abd:  soft, positive bowel sounds, no organomegally, no rebound, no guarding Ext:  2 plus pulses, no edema, no cyanosis, no clubbing Skin:  No rashes no nodules Neuro:  CN II through XII intact, motor grossly intact  EKG - NSR with IRBBB   Assess/Plan: 1. Atrial flutter - he is s/p ablation  and doing well. I have recommended he stop his systemic anti-coagulation. 2. RBBB - he has had no symptomatic bradycardia. I gave him the warning signs he might experience if his conduction system were to worsen.  [name].D.

## 2018-05-15 DIAGNOSIS — L600 Ingrowing nail: Secondary | ICD-10-CM | POA: Insufficient documentation

## 2018-05-16 ENCOUNTER — Ambulatory Visit (INDEPENDENT_AMBULATORY_CARE_PROVIDER_SITE_OTHER): Payer: Medicare Other

## 2018-05-16 DIAGNOSIS — L600 Ingrowing nail: Secondary | ICD-10-CM

## 2018-05-16 NOTE — Patient Instructions (Signed)

## 2018-05-22 NOTE — Progress Notes (Signed)
Patient is here today for follow-up appointment, recent procedure performed on 05/09/2018, removal of right lateral hallux nail border.  He states that overall he feels like the area is healing well and is not having pain at this time.  No redness, no erythema, no swelling, no drainage, no other signs and symptoms of infection.  The area is scabbed over and is healing well at this time.  Verbal and written instructions were given to the patient.  Discussed signs and symptoms of infection.  He is to follow-up as needed with any acute symptom changes.

## 2018-05-29 DIAGNOSIS — L600 Ingrowing nail: Secondary | ICD-10-CM | POA: Diagnosis not present

## 2018-06-02 DIAGNOSIS — M25511 Pain in right shoulder: Secondary | ICD-10-CM | POA: Diagnosis not present

## 2018-06-06 DIAGNOSIS — M25511 Pain in right shoulder: Secondary | ICD-10-CM | POA: Diagnosis not present

## 2018-06-06 DIAGNOSIS — M25512 Pain in left shoulder: Secondary | ICD-10-CM | POA: Diagnosis not present

## 2018-06-09 DIAGNOSIS — M25511 Pain in right shoulder: Secondary | ICD-10-CM | POA: Diagnosis not present

## 2018-06-09 DIAGNOSIS — M25512 Pain in left shoulder: Secondary | ICD-10-CM | POA: Diagnosis not present

## 2018-06-14 DIAGNOSIS — M25511 Pain in right shoulder: Secondary | ICD-10-CM | POA: Diagnosis not present

## 2018-06-14 DIAGNOSIS — M25512 Pain in left shoulder: Secondary | ICD-10-CM | POA: Diagnosis not present

## 2018-06-16 DIAGNOSIS — M25511 Pain in right shoulder: Secondary | ICD-10-CM | POA: Diagnosis not present

## 2018-06-16 DIAGNOSIS — M25512 Pain in left shoulder: Secondary | ICD-10-CM | POA: Diagnosis not present

## 2018-06-20 DIAGNOSIS — M25511 Pain in right shoulder: Secondary | ICD-10-CM | POA: Diagnosis not present

## 2018-06-20 DIAGNOSIS — M25512 Pain in left shoulder: Secondary | ICD-10-CM | POA: Diagnosis not present

## 2018-06-23 DIAGNOSIS — M25512 Pain in left shoulder: Secondary | ICD-10-CM | POA: Diagnosis not present

## 2018-06-23 DIAGNOSIS — M25511 Pain in right shoulder: Secondary | ICD-10-CM | POA: Diagnosis not present

## 2018-06-26 DIAGNOSIS — M25511 Pain in right shoulder: Secondary | ICD-10-CM | POA: Diagnosis not present

## 2018-06-26 DIAGNOSIS — M25512 Pain in left shoulder: Secondary | ICD-10-CM | POA: Diagnosis not present

## 2018-06-29 DIAGNOSIS — M25512 Pain in left shoulder: Secondary | ICD-10-CM | POA: Diagnosis not present

## 2018-06-29 DIAGNOSIS — M25511 Pain in right shoulder: Secondary | ICD-10-CM | POA: Diagnosis not present

## 2018-07-03 DIAGNOSIS — M25511 Pain in right shoulder: Secondary | ICD-10-CM | POA: Diagnosis not present

## 2018-07-03 DIAGNOSIS — M25512 Pain in left shoulder: Secondary | ICD-10-CM | POA: Diagnosis not present

## 2018-07-06 DIAGNOSIS — M25511 Pain in right shoulder: Secondary | ICD-10-CM | POA: Diagnosis not present

## 2018-07-06 DIAGNOSIS — M25512 Pain in left shoulder: Secondary | ICD-10-CM | POA: Diagnosis not present

## 2018-07-10 DIAGNOSIS — M25512 Pain in left shoulder: Secondary | ICD-10-CM | POA: Diagnosis not present

## 2018-07-10 DIAGNOSIS — M25511 Pain in right shoulder: Secondary | ICD-10-CM | POA: Diagnosis not present

## 2018-07-13 DIAGNOSIS — M25512 Pain in left shoulder: Secondary | ICD-10-CM | POA: Diagnosis not present

## 2018-07-13 DIAGNOSIS — M25511 Pain in right shoulder: Secondary | ICD-10-CM | POA: Diagnosis not present

## 2018-09-22 IMAGING — CR DG TIBIA/FIBULA 2V*L*
4 series · 4 of 4 positions shown · non-contrast
Comparison: None.

CLINICAL DATA: Blow to the anterior left lower leg on a car door 1
month ago. Continued pain. Initial encounter.

EXAM:
LEFT TIBIA AND FIBULA - 2 VIEW

[x tib-fib ap left (1 of 2)]
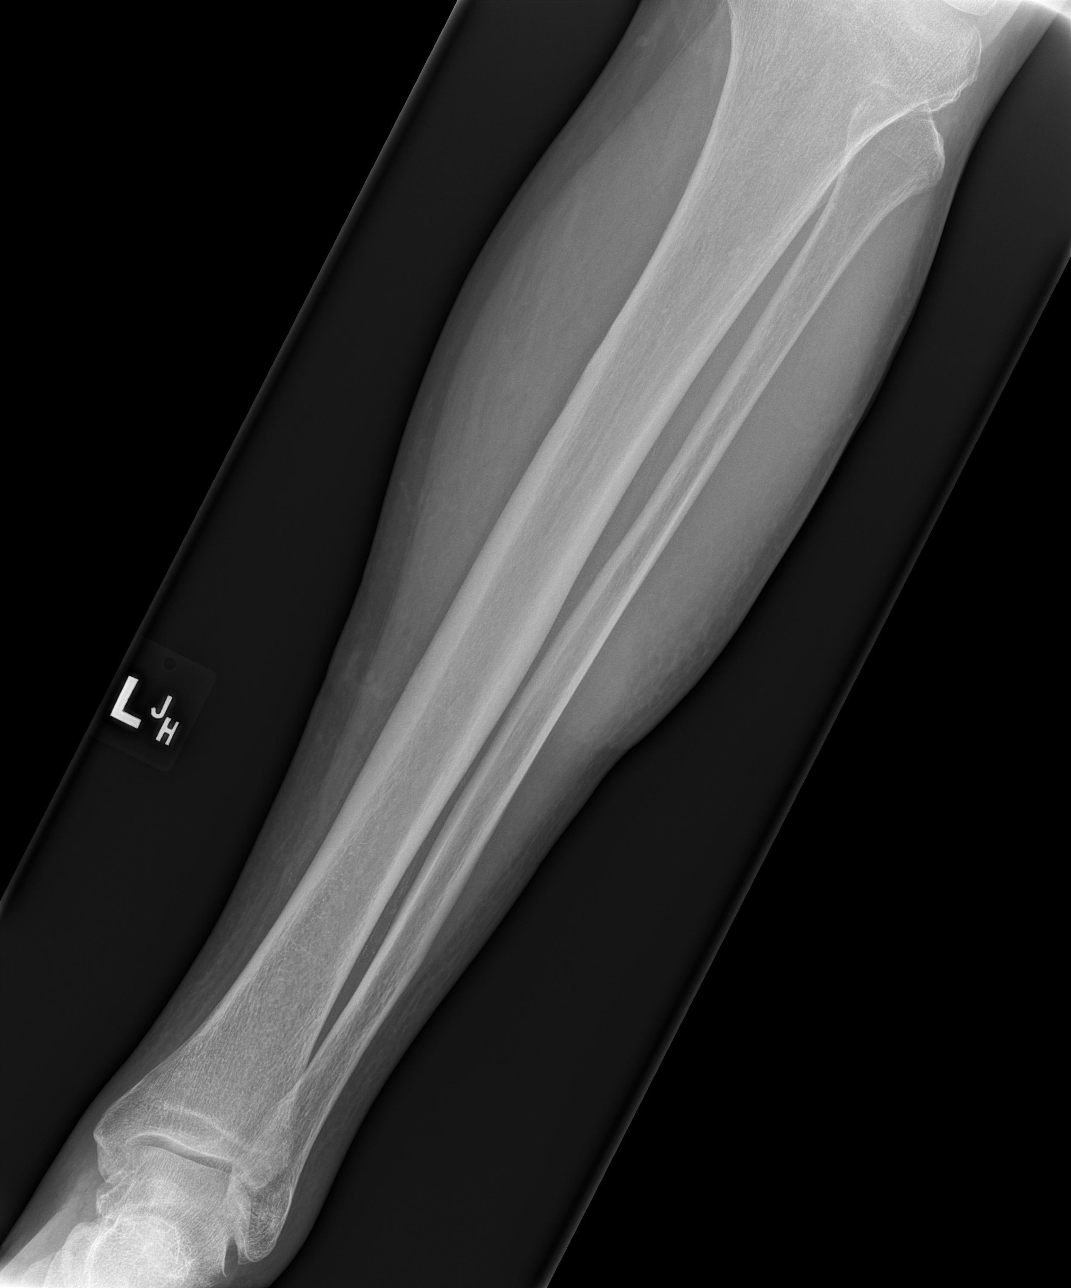

[x tib-fib ap left (2 of 2)]
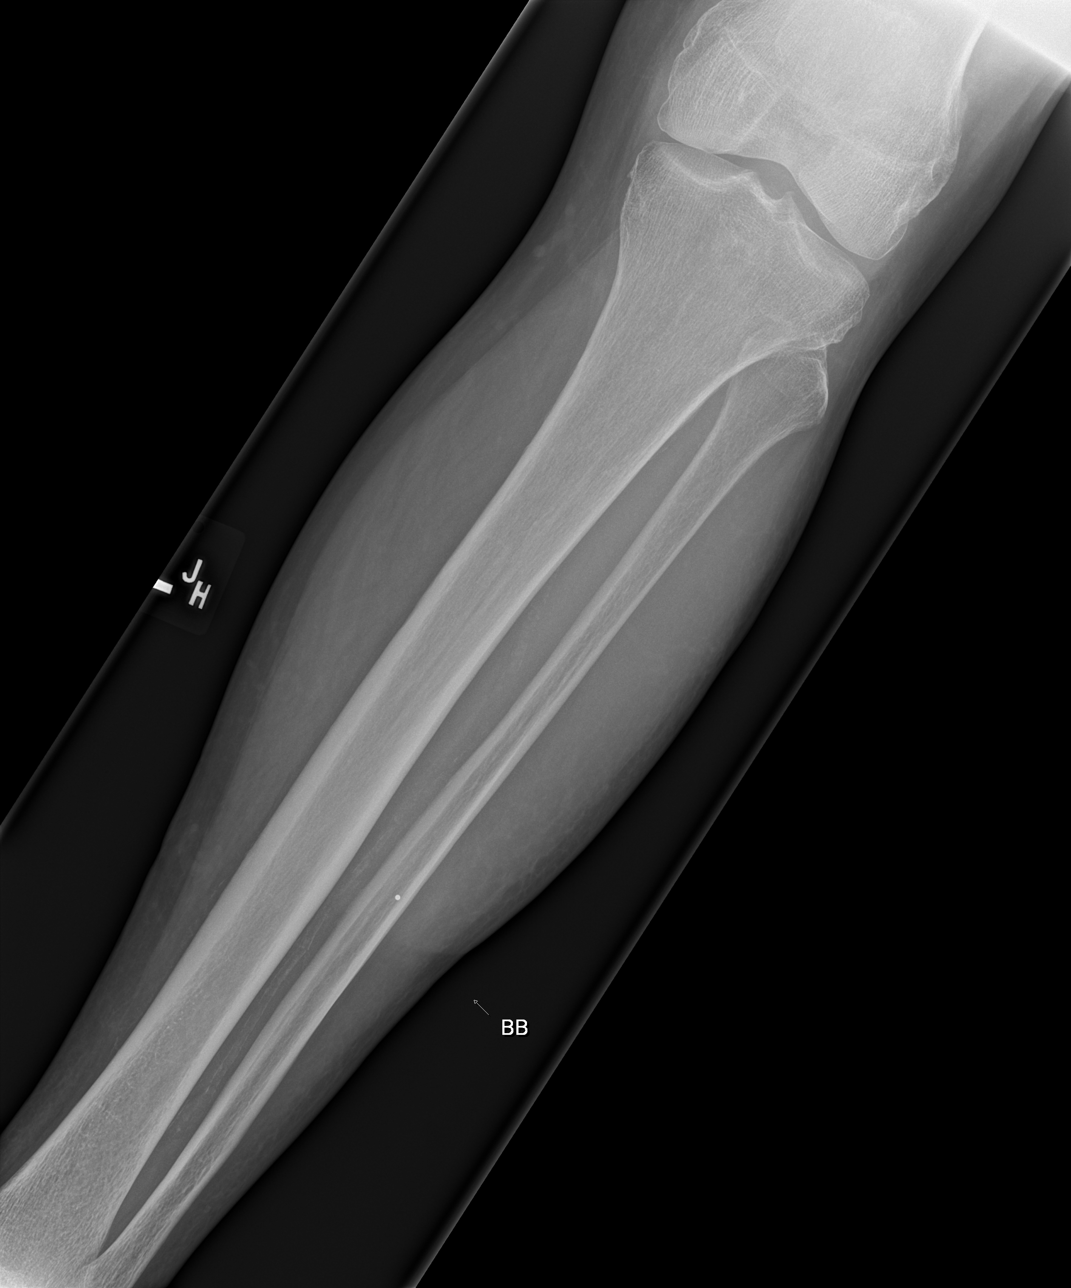

[x tib-fib lat left (1 of 2)]
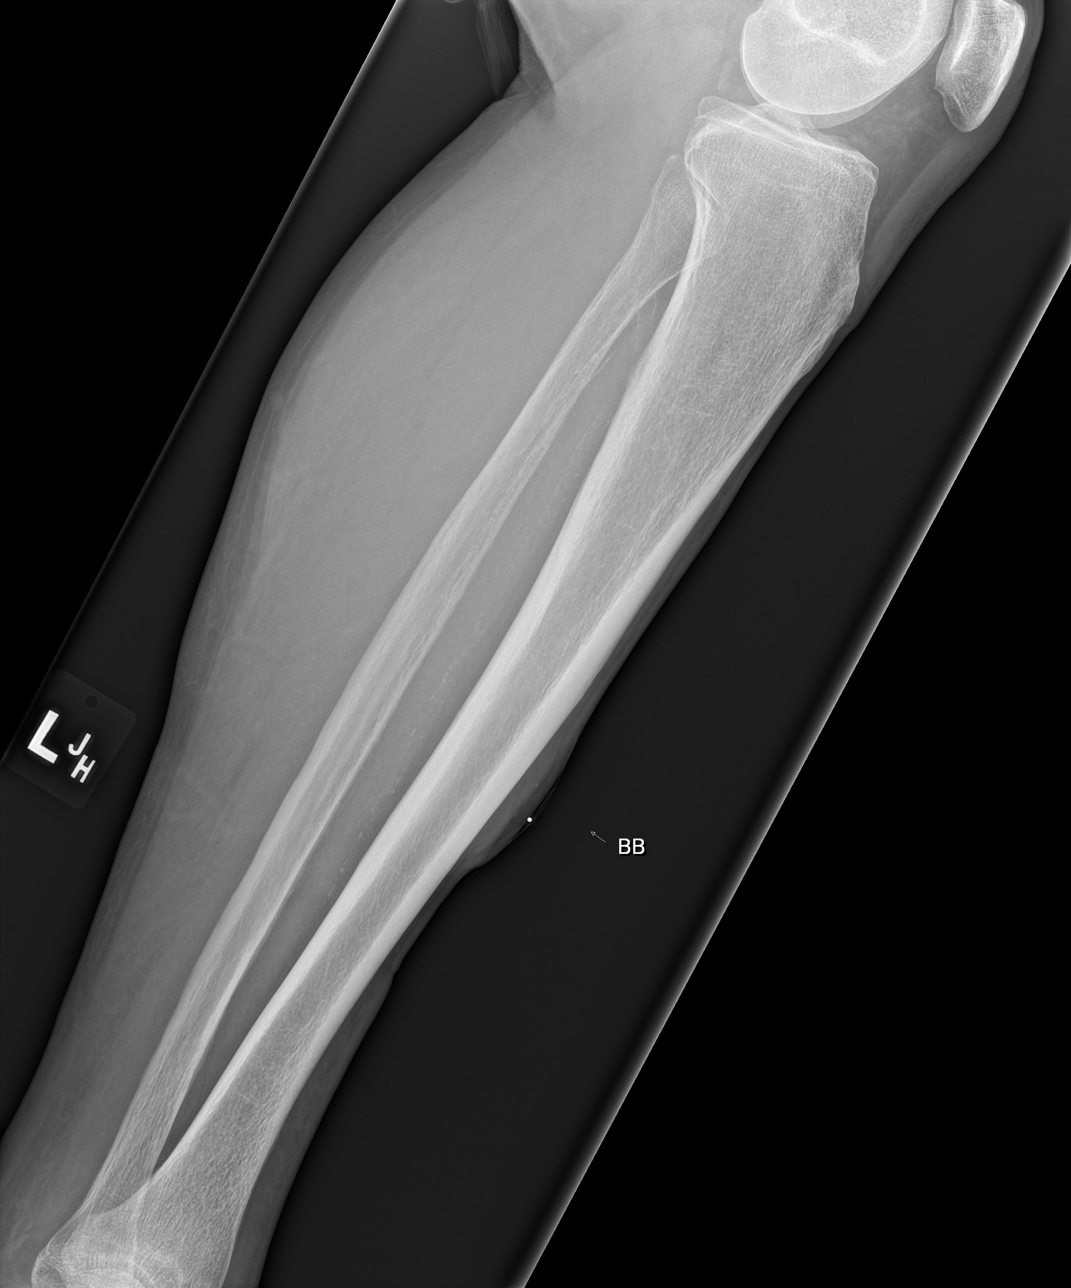

[x tib-fib lat left (2 of 2)]
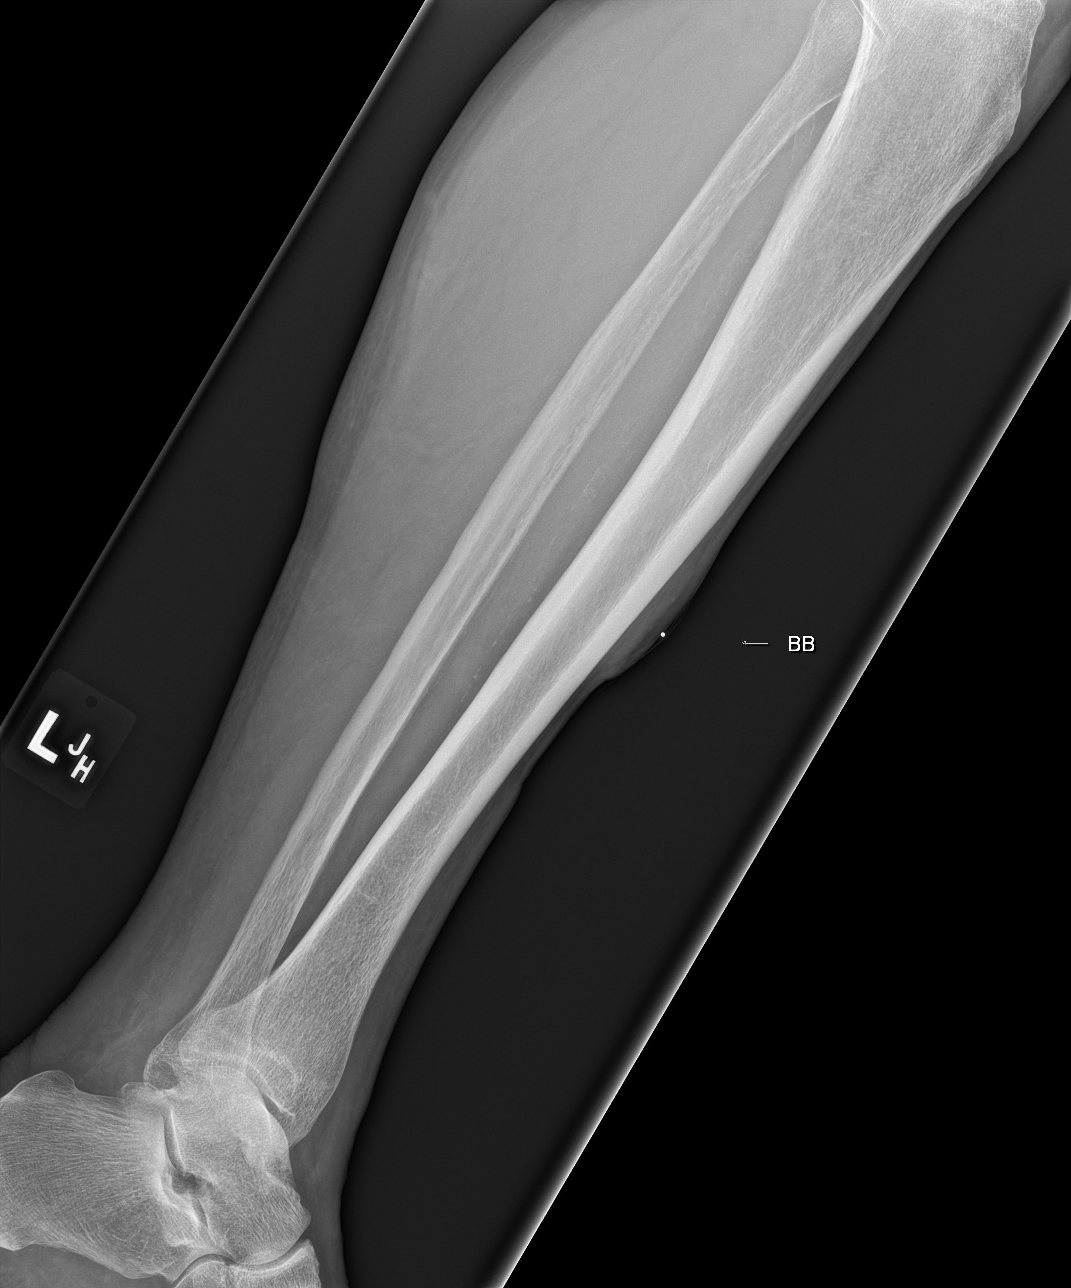

[4 of 4 positions shown; findings below may reference images not displayed]

FINDINGS: Marker is placed in the region of concern over the anterior aspect
of the left tibia. Soft tissue swelling is seen subjacent to the
marker. No radiopaque foreign body or bony abnormality is seen.
Atherosclerosis noted.
IMPRESSION: Soft tissue swelling in the region of concern without fracture or
foreign body.

## 2018-10-16 DIAGNOSIS — H4323 Crystalline deposits in vitreous body, bilateral: Secondary | ICD-10-CM | POA: Diagnosis not present

## 2018-10-16 DIAGNOSIS — H2513 Age-related nuclear cataract, bilateral: Secondary | ICD-10-CM | POA: Diagnosis not present

## 2019-01-25 DIAGNOSIS — Z23 Encounter for immunization: Secondary | ICD-10-CM | POA: Diagnosis not present

## 2019-02-08 ENCOUNTER — Other Ambulatory Visit: Payer: Self-pay | Admitting: Cardiology

## 2019-02-26 ENCOUNTER — Encounter (INDEPENDENT_AMBULATORY_CARE_PROVIDER_SITE_OTHER): Payer: Self-pay

## 2019-03-16 DIAGNOSIS — Z79899 Other long term (current) drug therapy: Secondary | ICD-10-CM | POA: Diagnosis not present

## 2019-03-16 DIAGNOSIS — Z0000 Encounter for general adult medical examination without abnormal findings: Secondary | ICD-10-CM | POA: Diagnosis not present

## 2019-03-16 DIAGNOSIS — I4892 Unspecified atrial flutter: Secondary | ICD-10-CM | POA: Diagnosis not present

## 2019-03-16 DIAGNOSIS — N400 Enlarged prostate without lower urinary tract symptoms: Secondary | ICD-10-CM | POA: Diagnosis not present

## 2019-03-16 DIAGNOSIS — M109 Gout, unspecified: Secondary | ICD-10-CM | POA: Diagnosis not present

## 2019-03-16 DIAGNOSIS — E7800 Pure hypercholesterolemia, unspecified: Secondary | ICD-10-CM | POA: Diagnosis not present

## 2019-03-16 DIAGNOSIS — I700 Atherosclerosis of aorta: Secondary | ICD-10-CM | POA: Diagnosis not present

## 2019-03-16 DIAGNOSIS — R7301 Impaired fasting glucose: Secondary | ICD-10-CM | POA: Diagnosis not present

## 2019-03-23 DIAGNOSIS — Z1211 Encounter for screening for malignant neoplasm of colon: Secondary | ICD-10-CM | POA: Diagnosis not present

## 2019-03-26 ENCOUNTER — Ambulatory Visit (INDEPENDENT_AMBULATORY_CARE_PROVIDER_SITE_OTHER): Payer: Medicare Other | Admitting: Urology

## 2019-03-26 ENCOUNTER — Encounter: Payer: Self-pay | Admitting: Urology

## 2019-03-26 ENCOUNTER — Other Ambulatory Visit: Payer: Self-pay

## 2019-03-26 VITALS — BP 95/54 | HR 93 | Ht 75.0 in | Wt 210.0 lb

## 2019-03-26 DIAGNOSIS — N401 Enlarged prostate with lower urinary tract symptoms: Secondary | ICD-10-CM | POA: Diagnosis not present

## 2019-03-26 DIAGNOSIS — R3914 Feeling of incomplete bladder emptying: Secondary | ICD-10-CM

## 2019-03-26 LAB — BLADDER SCAN AMB NON-IMAGING

## 2019-03-26 NOTE — Progress Notes (Signed)
   03/26/2019 10:02 AM   Logan Aguirre 1940-09-28 [id number]  Reason for visit: Follow up HoLEP  HPI: I saw [name] back in urology clinic for 1 year follow-up after HOLEP.  He is a 78 year old male who was previously on Xarelto for atrial fibrillation who underwent a HOLEP on 02/07/2018 for 150 g prostate.  He had some minor issues with hematuria post-op when he resumed his Xarelto, however that resolved without any intervention.  Since that time, he had an ablation for his atrial fibrillation is no longer on Xarelto.  Overall he is delighted with his urinary symptoms today.  He is emptying well with a strong stream and denies any urinary complaints.  He denies any leakage or other complaints.  His PVR at his last visit was 30 cc.  Follow-up as needed  A total of 15 minutes were spent face-to-face with the patient, greater than 50% was spent in patient education, counseling, and coordination of care regarding HOLEP and postoperative expectations.  Billey Co, [facility] [address], [place], [place] [address] [phone]

## 2019-07-31 DIAGNOSIS — H25813 Combined forms of age-related cataract, bilateral: Secondary | ICD-10-CM | POA: Diagnosis not present

## 2019-08-07 ENCOUNTER — Other Ambulatory Visit: Payer: Self-pay | Admitting: Urology

## 2019-10-23 ENCOUNTER — Other Ambulatory Visit: Payer: Self-pay | Admitting: Cardiology

## 2019-10-24 ENCOUNTER — Telehealth: Payer: Self-pay | Admitting: Cardiology

## 2019-10-24 NOTE — Telephone Encounter (Signed)
Patient called to speak with Dr. Radford Pax or nurse first before making an appt. Please call back

## 2019-10-24 NOTE — Telephone Encounter (Signed)
LM TO CALL BACK ./CY 

## 2019-10-26 ENCOUNTER — Telehealth: Payer: Self-pay | Admitting: Cardiology

## 2019-10-26 NOTE — Telephone Encounter (Signed)
Left message for patient to call back  

## 2019-11-15 ENCOUNTER — Other Ambulatory Visit: Payer: Self-pay | Admitting: Cardiology

## 2019-12-13 ENCOUNTER — Other Ambulatory Visit: Payer: Self-pay | Admitting: Cardiology

## 2019-12-23 NOTE — Progress Notes (Signed)
Cardiology Office Note:    Date:  12/24/2019   ID:  Logan Aguirre, Logan Aguirre 1940/11/10, MRN [id number]  PCP:  Jonathon Jordan, MD  Cardiologist:  Fransico Him, MD    Referring MD: Jonathon Jordan, MD   Chief Complaint  Patient presents with  . Follow-up    RBBB, SVT, atrial flutter    History of Present Illness:    Logan Aguirre is a 79 y.o. male with a hx of normal coronary arteries, chronic RBBB, SVT, atrial flutter on Xarelto, CHA2DS2-VASc equals 4, prior TIA.  He is here today for followup and is doing well.  He denies any chest pain or pressure, SOB, DOE (except running up stairs), PND, orthopnea, LE edema, dizziness (except when getting up too fast), palpitations or syncope. He is compliant with his meds and is tolerating meds with no SE.    Past Medical History:  Diagnosis Date  . Arthritis    hands / thumbs  . Atrial flutter ([place])    in setting of URI now on Xarelto for a CHADS2VASC score of 4  . BPH (benign prostatic hyperplasia)   . Colon polyp   . Fatigue due to excessive exertion 03/18/2014  . GERD (gastroesophageal reflux disease)   . Gout   . Hypertension   . Paroxysmal supraventricular tachycardia ([place])    Dr Radford Pax  . Pure hypercholesterolemia   . Right bundle branch block    normal coronary arteries   . Stroke [facility]) 2009   TIA  . Syncope and collapse    ? TIA   . 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 A-FLUTTER ABLATION N/A 04/17/2018   Procedure: A-FLUTTER ABLATION;  Surgeon: Evans Lance, MD;  Location: [facility];  Service: Cardiovascular;  Laterality: N/A;  . CARDIAC CATHETERIZATION     normal coronary arteries  . HOLEP-LASER ENUCLEATION OF THE PROSTATE WITH MORCELLATION N/A 02/07/2018   Procedure: HOLEP-LASER ENUCLEATION OF THE PROSTATE WITH MORCELLATION;  Surgeon: Billey Co, MD;  Location: [facility] ORS;  Service: Urology;  Laterality: N/A;  . LEFT HEART  CATHETERIZATION WITH CORONARY ANGIOGRAM N/A 03/20/2014   Procedure: LEFT HEART CATHETERIZATION WITH CORONARY ANGIOGRAM;  Surgeon: Burnell Blanks, MD;  [facility] CATH LAB;  Service: Cardiovascular;  Laterality: N/A;  . TONSILLECTOMY      Current Medications: Current Meds  Medication Sig  . allopurinol (ZYLOPRIM) 300 MG tablet Take 300 mg by mouth at bedtime.   Marland Kitchen atorvastatin (LIPITOR) 10 MG tablet Take 1 tablet (10 mg total) by mouth daily.  Marland Kitchen docusate sodium (COLACE) 100 MG capsule Take 100 mg by mouth every evening.   Marland Kitchen ibuprofen (ADVIL,MOTRIN) 200 MG tablet Take 200 mg by mouth every 6 (six) hours as needed for moderate pain (for pain).   Marland Kitchen loratadine (CLARITIN) 10 MG tablet Take 10 mg by mouth daily as needed (for allergies or congestion).   Marland Kitchen omeprazole (PRILOSEC) 20 MG capsule Take 1 capsule (20 mg total) by mouth daily.  . verapamil (CALAN-SR) 120 MG CR tablet Take 120 mg by mouth 2 (two) times daily.     Allergies:   Amoxicillin, Shrimp [shellfish allergy], and Zantac [ranitidine hcl]   Social History   Socioeconomic History  . Marital status: Married    Spouse name: [name], retail  . Number of children: Not on file  . Years of education: Not on file  . Highest education  level: Not on file  Occupational History    Comment: retired  Tobacco Use  . Smoking status: Former Smoker    Types: Cigars, Cigarettes    Quit date: 1955    Years since quitting: 66.6  . Smokeless tobacco: Never Used  Vaping Use  . Vaping Use: Never used  Substance and Sexual Activity  . Alcohol use: Yes    Alcohol/week: 8.0 standard drinks    Types: 8 Glasses of wine per week  . Drug use: No  . Sexual activity: Not on file  Other Topics Concern  . Not on file  Social History Narrative   Current smoker cigar (occasional)   Alcohol -yes wine 3x a week   No caffeine    No recreational drug use   Diet yes- diet no salt ,no caffeine   Occupation Corporate investment banker status    Children 2   Social Determinants of Radio broadcast assistant Strain:   . Difficulty of Paying Living Expenses: Not on file  Food Insecurity:   . Worried About Charity fundraiser in the Last Year: Not on file  . Ran Out of Food in the Last Year: Not on file  Transportation Needs:   . Lack of Transportation (Medical): Not on file  . Lack of Transportation (Non-Medical): Not on file  Physical Activity:   . Days of Exercise per Week: Not on file  . Minutes of Exercise per Session: Not on file  Stress:   . Feeling of Stress : Not on file  Social Connections:   . Frequency of Communication with Friends and Family: Not on file  . Frequency of Social Gatherings with Friends and Family: Not on file  . Attends Religious Services: Not on file  . Active Member of Clubs or Organizations: Not on file  . Attends Archivist Meetings: Not on file  . Marital Status: Not on file     Family History: The patient's family history includes COPD in his father; CVA in his brother; Colon cancer in his father; Multiple myeloma in his mother; Suicidality in his brother.  ROS:   Please see the history of present illness.    ROS  All other systems reviewed and negative.   EKGs/Labs/Other Studies Reviewed:    The following studies were reviewed today: none  EKG:  EKG is  ordered today and showed NSR and RBBB and LPFB Recent Labs: No results found for requested labs within last 8760 hours.   Recent Lipid Panel    Component Value Date/Time   CHOL  11/07/2007 2355    173        ATP III CLASSIFICATION:  <200     mg/dL   Desirable  200-239  mg/dL   Borderline High  >=240    mg/dL   High   TRIG 144 11/07/2007 2355   HDL 27 (L) 11/07/2007 2355   CHOLHDL 6.4 11/07/2007 2355   VLDL 29 11/07/2007 2355   LDLCALC (H) 11/07/2007 2355    117        Total Cholesterol/HDL:CHD Risk Coronary Heart Disease Risk Table                     Men   Women  1/2 Average Risk   3.4   3.3     Physical Exam:    VS:  BP 124/68   Pulse 73   Ht 6' 3"  (1.905 m)   Wt 210 lb (95.3  kg)   SpO2 99%   BMI 26.25 kg/m     Wt Readings from Last 3 Encounters:  12/24/19 210 lb (95.3 kg)  03/26/19 210 lb (95.3 kg)  05/10/18 209 lb (94.8 kg)     GEN: Well nourished, well developed in no acute distress HEENT: Normal NECK: No JVD; No carotid bruits LYMPHATICS: No lymphadenopathy CARDIAC:RRR, no murmurs, rubs, gallops.  Paradoxicially split S2 RESPIRATORY:  Clear to auscultation without rales, wheezing or rhonchi  ABDOMEN: Soft, non-tender, non-distended MUSCULOSKELETAL:  No edema; No deformity  SKIN: Warm and dry NEUROLOGIC:  Alert and oriented x 3 PSYCHIATRIC:  Normal affect    ASSESSMENT:    1. Orthostatic hypotension   2. Atrial flutter, unspecified type ([place])   3. RBBB    PLAN:    In order of problems listed above:  1. Orthostatic hypotension -secondary to decreased po intake -he has not had any further dizziness after decreasing Cardizem dose   2.  Paroxysmal atrial flutter  -followed by Dr. Lovena Le in EP s/p aflutter ablation -denies any further palpitations -continue Verapamil SR 125m daily  3.  RBBB  -noted as far back as November 2014 with nuclear stress test in 2015 showing no ischemia.   Medication Adjustments/Labs and Tests Ordered: Current medicines are reviewed at length with the patient today.  Concerns regarding medicines are outlined above.  Orders Placed This Encounter  Procedures  . EKG 12-Lead   No orders of the defined types were placed in this encounter.   Signed, TFransico Him MD  12/24/2019 3:36 PM    [place]

## 2019-12-24 ENCOUNTER — Ambulatory Visit (INDEPENDENT_AMBULATORY_CARE_PROVIDER_SITE_OTHER): Payer: Medicare Other | Admitting: Cardiology

## 2019-12-24 ENCOUNTER — Other Ambulatory Visit: Payer: Self-pay

## 2019-12-24 ENCOUNTER — Encounter: Payer: Self-pay | Admitting: Cardiology

## 2019-12-24 VITALS — BP 124/68 | HR 73 | Ht 75.0 in | Wt 210.0 lb

## 2019-12-24 DIAGNOSIS — I4510 Unspecified right bundle-branch block: Secondary | ICD-10-CM

## 2019-12-24 DIAGNOSIS — I4892 Unspecified atrial flutter: Secondary | ICD-10-CM

## 2019-12-24 DIAGNOSIS — I951 Orthostatic hypotension: Secondary | ICD-10-CM

## 2019-12-24 NOTE — Patient Instructions (Signed)

## 2020-01-20 DIAGNOSIS — Z23 Encounter for immunization: Secondary | ICD-10-CM | POA: Diagnosis not present

## 2020-01-27 IMAGING — CR DG FINGER MIDDLE 2+V*L*
3 series · 3 of 3 positions shown · non-contrast
Comparison: None.

CLINICAL DATA: Middle finger got stuck in garage door with bruising
and laceration of the distal phalanx. Concern for open fracture.

EXAM:
LEFT MIDDLE FINGER 2+V

[x finger pa left]
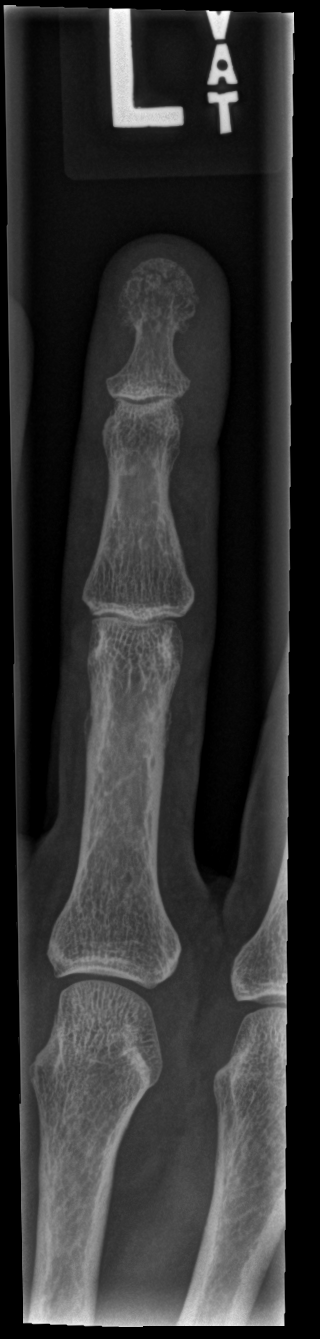

[x finger obl left]
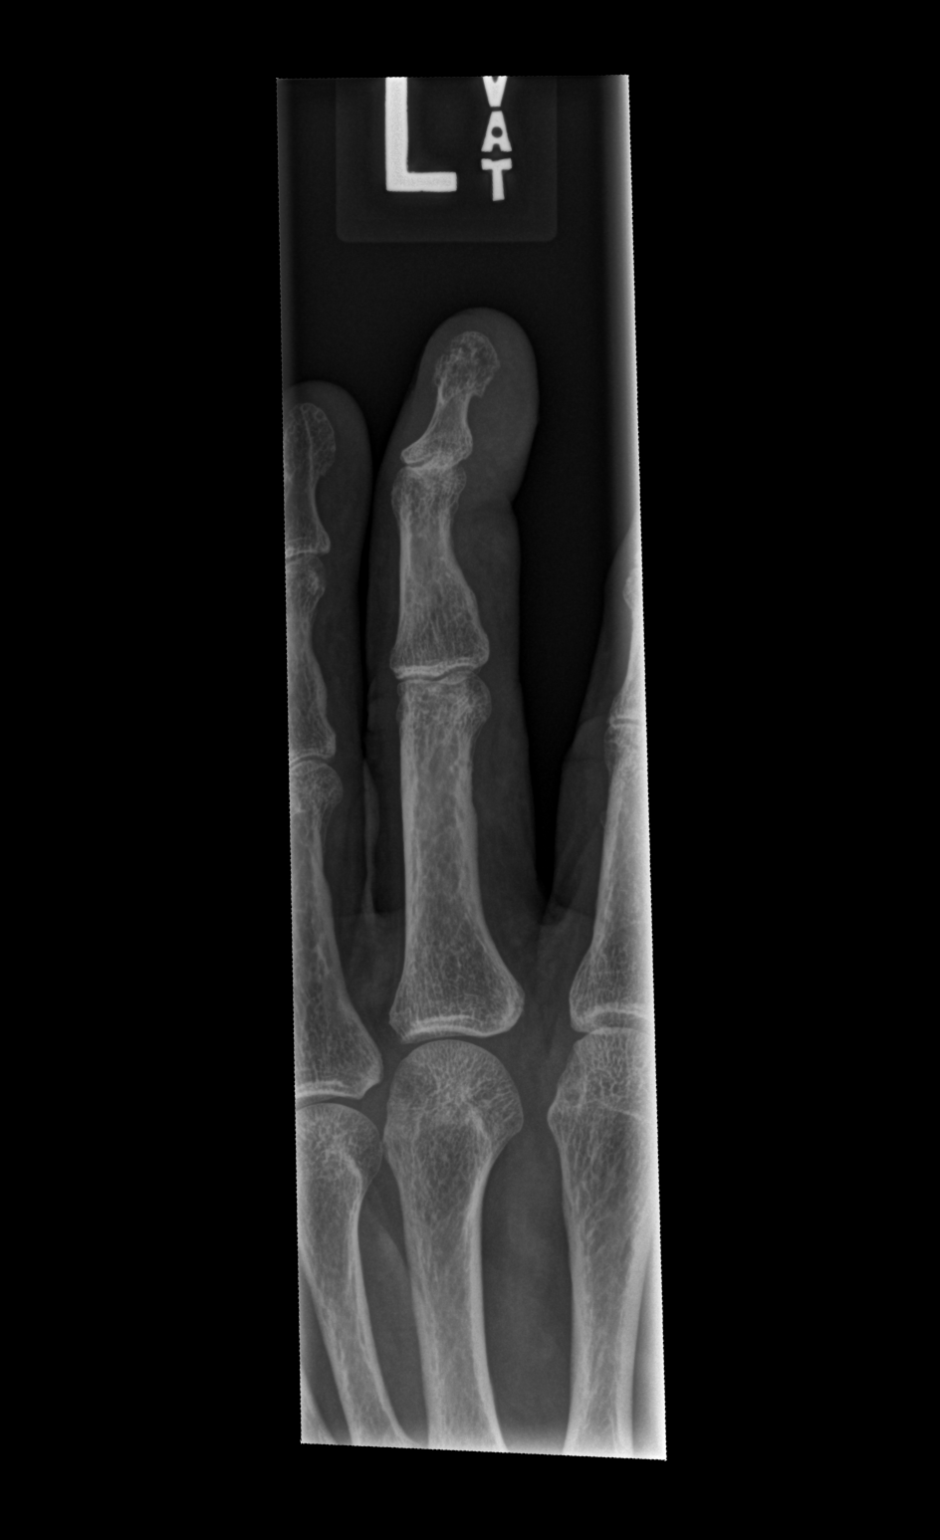

[x finger lat left]
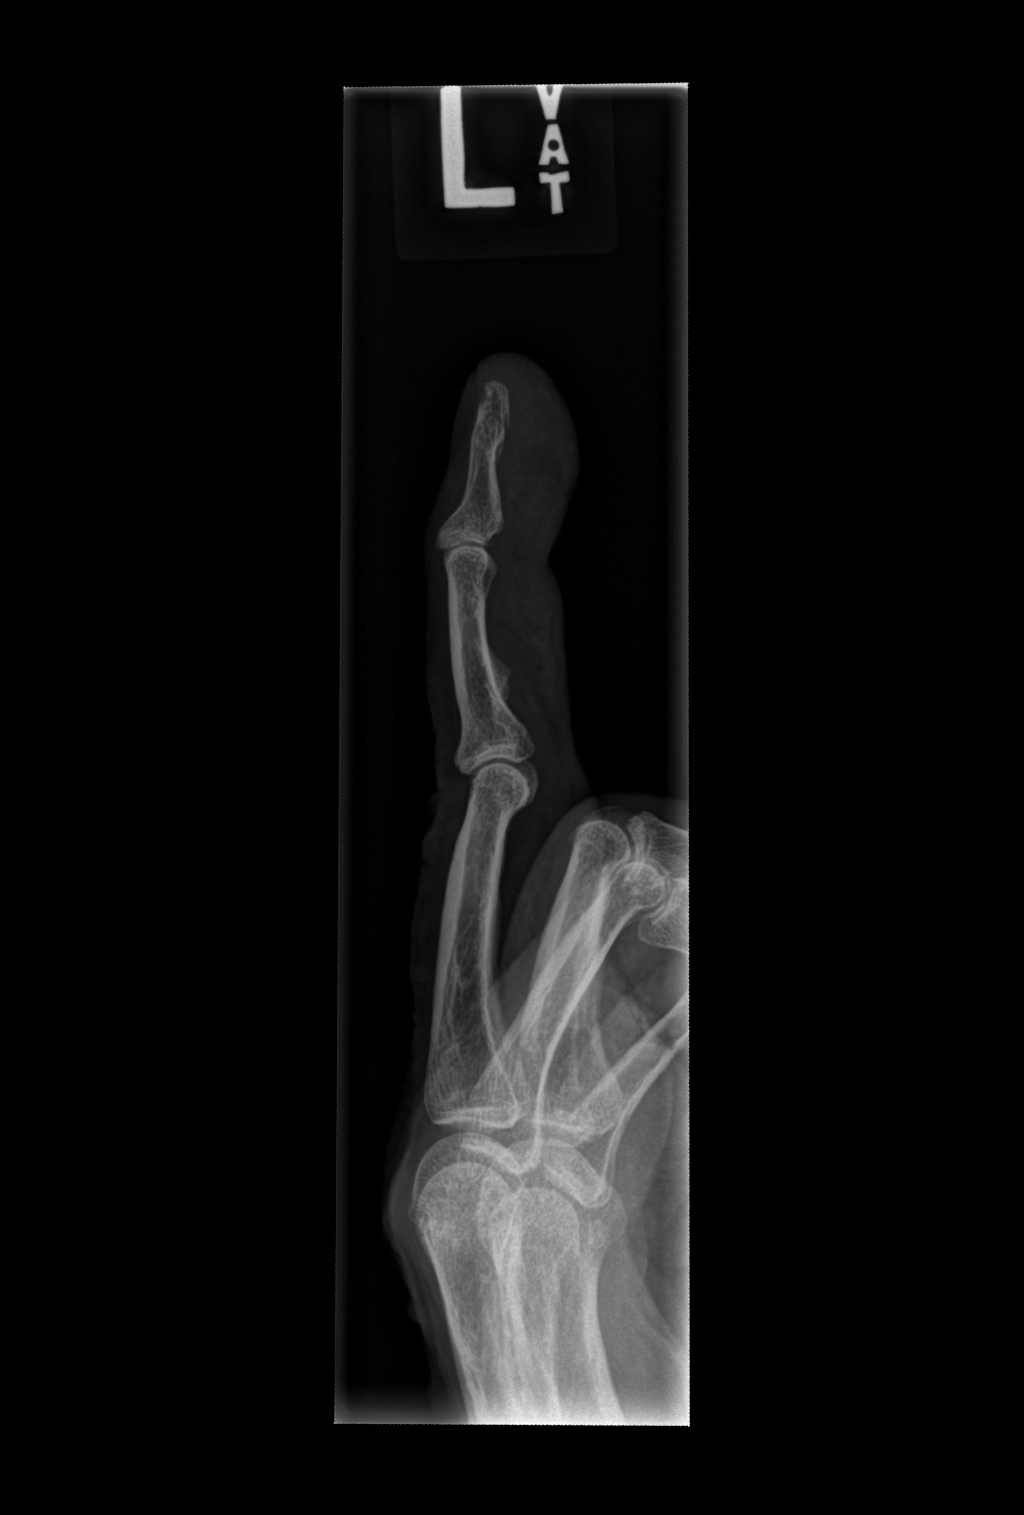

[3 of 3 positions shown; findings below may reference images not displayed]

FINDINGS: Acute closed tuft fracture of the left middle finger is identified
without significant displacement or angulation. Slight
osteoarthritic joint space narrowing of the DIP and PIP joints of
the left middle finger. Associated soft tissue swelling seen. No
radiopaque foreign body. No soft tissue laceration or emphysema is
seen.
IMPRESSION: Acute, closed appearing tuft fracture of the left middle finger
without significant displacement or angulation. No joint
dislocation. Osteoarthritis of the DIP and PIP joints.

## 2020-02-13 DIAGNOSIS — Z23 Encounter for immunization: Secondary | ICD-10-CM | POA: Diagnosis not present

## 2020-04-04 DIAGNOSIS — I471 Supraventricular tachycardia: Secondary | ICD-10-CM | POA: Diagnosis not present

## 2020-04-04 DIAGNOSIS — R7301 Impaired fasting glucose: Secondary | ICD-10-CM | POA: Diagnosis not present

## 2020-04-04 DIAGNOSIS — Z0000 Encounter for general adult medical examination without abnormal findings: Secondary | ICD-10-CM | POA: Diagnosis not present

## 2020-04-04 DIAGNOSIS — I4892 Unspecified atrial flutter: Secondary | ICD-10-CM | POA: Diagnosis not present

## 2020-04-04 DIAGNOSIS — Z79899 Other long term (current) drug therapy: Secondary | ICD-10-CM | POA: Diagnosis not present

## 2020-04-04 DIAGNOSIS — K219 Gastro-esophageal reflux disease without esophagitis: Secondary | ICD-10-CM | POA: Diagnosis not present

## 2020-04-04 DIAGNOSIS — N400 Enlarged prostate without lower urinary tract symptoms: Secondary | ICD-10-CM | POA: Diagnosis not present

## 2020-04-04 DIAGNOSIS — G459 Transient cerebral ischemic attack, unspecified: Secondary | ICD-10-CM | POA: Diagnosis not present

## 2020-04-04 DIAGNOSIS — M109 Gout, unspecified: Secondary | ICD-10-CM | POA: Diagnosis not present

## 2020-04-04 DIAGNOSIS — E7800 Pure hypercholesterolemia, unspecified: Secondary | ICD-10-CM | POA: Diagnosis not present

## 2020-04-04 DIAGNOSIS — I700 Atherosclerosis of aorta: Secondary | ICD-10-CM | POA: Diagnosis not present

## 2020-04-04 DIAGNOSIS — I4510 Unspecified right bundle-branch block: Secondary | ICD-10-CM | POA: Diagnosis not present

## 2020-04-14 DIAGNOSIS — Z1211 Encounter for screening for malignant neoplasm of colon: Secondary | ICD-10-CM | POA: Diagnosis not present

## 2020-04-15 DIAGNOSIS — L711 Rhinophyma: Secondary | ICD-10-CM | POA: Diagnosis not present

## 2020-04-15 DIAGNOSIS — L821 Other seborrheic keratosis: Secondary | ICD-10-CM | POA: Diagnosis not present

## 2020-04-15 DIAGNOSIS — D1801 Hemangioma of skin and subcutaneous tissue: Secondary | ICD-10-CM | POA: Diagnosis not present

## 2020-05-09 ENCOUNTER — Other Ambulatory Visit: Payer: Self-pay

## 2020-05-09 MED ORDER — VERAPAMIL HCL ER 120 MG PO TBCR: 120.0000 mg | EXTENDED_RELEASE_TABLET | Freq: Two times a day (BID) | ORAL | 2 refills | Status: DC

## 2020-05-09 NOTE — Telephone Encounter (Signed)
Pt's medication was sent to pt's pharmacy as requested. Confirmation received.  °

## 2020-05-12 MED ORDER — DILTIAZEM HCL ER COATED BEADS 180 MG PO CP24: 180.0000 mg | ORAL_CAPSULE | Freq: Every day | ORAL | 3 refills | Status: DC

## 2020-07-28 DIAGNOSIS — Z111 Encounter for screening for respiratory tuberculosis: Secondary | ICD-10-CM | POA: Diagnosis not present

## 2020-09-13 DIAGNOSIS — Z23 Encounter for immunization: Secondary | ICD-10-CM | POA: Diagnosis not present

## 2021-01-24 DIAGNOSIS — Z23 Encounter for immunization: Secondary | ICD-10-CM | POA: Diagnosis not present

## 2021-02-15 DIAGNOSIS — Z23 Encounter for immunization: Secondary | ICD-10-CM | POA: Diagnosis not present

## 2021-02-16 DIAGNOSIS — J069 Acute upper respiratory infection, unspecified: Secondary | ICD-10-CM | POA: Diagnosis not present

## 2021-02-16 DIAGNOSIS — R0981 Nasal congestion: Secondary | ICD-10-CM | POA: Diagnosis not present

## 2021-03-23 DIAGNOSIS — U071 COVID-19: Secondary | ICD-10-CM | POA: Diagnosis not present

## 2021-04-16 ENCOUNTER — Other Ambulatory Visit: Payer: Self-pay | Admitting: Cardiology

## 2021-04-21 DIAGNOSIS — Z79899 Other long term (current) drug therapy: Secondary | ICD-10-CM | POA: Diagnosis not present

## 2021-04-21 DIAGNOSIS — I471 Supraventricular tachycardia: Secondary | ICD-10-CM | POA: Diagnosis not present

## 2021-04-21 DIAGNOSIS — I4892 Unspecified atrial flutter: Secondary | ICD-10-CM | POA: Diagnosis not present

## 2021-04-21 DIAGNOSIS — M109 Gout, unspecified: Secondary | ICD-10-CM | POA: Diagnosis not present

## 2021-04-21 DIAGNOSIS — Z0000 Encounter for general adult medical examination without abnormal findings: Secondary | ICD-10-CM | POA: Diagnosis not present

## 2021-04-21 DIAGNOSIS — R7301 Impaired fasting glucose: Secondary | ICD-10-CM | POA: Diagnosis not present

## 2021-04-21 DIAGNOSIS — I4510 Unspecified right bundle-branch block: Secondary | ICD-10-CM | POA: Diagnosis not present

## 2021-04-21 DIAGNOSIS — I700 Atherosclerosis of aorta: Secondary | ICD-10-CM | POA: Diagnosis not present

## 2021-04-21 DIAGNOSIS — Z23 Encounter for immunization: Secondary | ICD-10-CM | POA: Diagnosis not present

## 2021-04-21 DIAGNOSIS — N400 Enlarged prostate without lower urinary tract symptoms: Secondary | ICD-10-CM | POA: Diagnosis not present

## 2021-04-21 DIAGNOSIS — K219 Gastro-esophageal reflux disease without esophagitis: Secondary | ICD-10-CM | POA: Diagnosis not present

## 2021-04-21 DIAGNOSIS — E7800 Pure hypercholesterolemia, unspecified: Secondary | ICD-10-CM | POA: Diagnosis not present

## 2021-04-23 DIAGNOSIS — Z79899 Other long term (current) drug therapy: Secondary | ICD-10-CM | POA: Diagnosis not present

## 2021-04-23 DIAGNOSIS — R7301 Impaired fasting glucose: Secondary | ICD-10-CM | POA: Diagnosis not present

## 2021-04-23 DIAGNOSIS — M109 Gout, unspecified: Secondary | ICD-10-CM | POA: Diagnosis not present

## 2021-04-23 DIAGNOSIS — E7800 Pure hypercholesterolemia, unspecified: Secondary | ICD-10-CM | POA: Diagnosis not present

## 2021-05-12 ENCOUNTER — Other Ambulatory Visit: Payer: Self-pay | Admitting: Cardiology

## 2021-06-09 ENCOUNTER — Other Ambulatory Visit: Payer: Self-pay | Admitting: Cardiology

## 2021-06-17 DIAGNOSIS — H35363 Drusen (degenerative) of macula, bilateral: Secondary | ICD-10-CM | POA: Diagnosis not present

## 2021-06-17 DIAGNOSIS — H25813 Combined forms of age-related cataract, bilateral: Secondary | ICD-10-CM | POA: Diagnosis not present

## 2021-06-22 DIAGNOSIS — H2511 Age-related nuclear cataract, right eye: Secondary | ICD-10-CM | POA: Diagnosis not present

## 2021-06-22 DIAGNOSIS — H353131 Nonexudative age-related macular degeneration, bilateral, early dry stage: Secondary | ICD-10-CM | POA: Diagnosis not present

## 2021-06-22 DIAGNOSIS — H2512 Age-related nuclear cataract, left eye: Secondary | ICD-10-CM | POA: Diagnosis not present

## 2021-07-02 ENCOUNTER — Encounter: Payer: Self-pay | Admitting: Cardiology

## 2021-07-02 DIAGNOSIS — I4892 Unspecified atrial flutter: Secondary | ICD-10-CM

## 2021-07-02 DIAGNOSIS — I4891 Unspecified atrial fibrillation: Secondary | ICD-10-CM

## 2021-07-02 DIAGNOSIS — H2512 Age-related nuclear cataract, left eye: Secondary | ICD-10-CM | POA: Diagnosis not present

## 2021-07-06 ENCOUNTER — Telehealth (HOSPITAL_COMMUNITY): Payer: Self-pay | Admitting: Nurse Practitioner

## 2021-07-06 NOTE — Telephone Encounter (Signed)
Called and left message for patient to call [facility] to schedule appt per Referral from Dr. Ashok Norris. ?

## 2021-07-09 ENCOUNTER — Other Ambulatory Visit: Payer: Self-pay

## 2021-07-09 ENCOUNTER — Ambulatory Visit (HOSPITAL_COMMUNITY)
Admission: RE | Admit: 2021-07-09 | Discharge: 2021-07-09 | Disposition: A | Discharge status: Home / Self Care | Payer: Medicare Other | Source: Ambulatory Visit | Attending: Physician Assistant | Admitting: Physician Assistant

## 2021-07-09 VITALS — BP 142/76 | HR 68 | Ht 75.0 in | Wt 211.6 lb

## 2021-07-09 DIAGNOSIS — Z7901 Long term (current) use of anticoagulants: Secondary | ICD-10-CM | POA: Diagnosis not present

## 2021-07-09 DIAGNOSIS — Z8673 Personal history of transient ischemic attack (TIA), and cerebral infarction without residual deficits: Secondary | ICD-10-CM | POA: Insufficient documentation

## 2021-07-09 DIAGNOSIS — D6869 Other thrombophilia: Secondary | ICD-10-CM | POA: Insufficient documentation

## 2021-07-09 DIAGNOSIS — I480 Paroxysmal atrial fibrillation: Secondary | ICD-10-CM | POA: Insufficient documentation

## 2021-07-09 DIAGNOSIS — I4892 Unspecified atrial flutter: Secondary | ICD-10-CM | POA: Insufficient documentation

## 2021-07-09 DIAGNOSIS — I10 Essential (primary) hypertension: Secondary | ICD-10-CM | POA: Diagnosis not present

## 2021-07-09 MED ORDER — APIXABAN 5 MG PO TABS: 5.0000 mg | ORAL_TABLET | Freq: Two times a day (BID) | ORAL | 3 refills | Status: DC

## 2021-07-09 NOTE — Patient Instructions (Signed)
Start Eliquis 5mg twice a day 

## 2021-07-09 NOTE — Progress Notes (Signed)
Primary Care Physician: Jonathon Jordan, MD Primary Cardiologist: Dr Radford Pax  Primary Electrophysiologist: Dr Lovena Le Referring Physician: Dr Gaspar Skeeters is a 81 y.o. male with a history of HTN, TIA, atrial flutter, atrial fibrillation who presents for consultation in the [facility].  The patient was initially diagnosed with atrial flutter and underwent ablation 04/17/18 with Dr Lovena Le. He had done well until 07/02/21 when he was found to be in atrial fibrillation during cataract surgery. He was asymptomatic and rate controlled. Patient has a CHADS2VASC score of 5. He is back in SR today. He does consume alcohol daily. He denies significant snoring.   Today, he denies symptoms of palpitations, chest pain, shortness of breath, orthopnea, PND, lower extremity edema, dizziness, presyncope, syncope, snoring, daytime somnolence, bleeding, or neurologic sequela. The patient is tolerating medications without difficulties and is otherwise without complaint today.    Atrial Fibrillation Risk Factors:  he does not have symptoms or diagnosis of sleep apnea. he does not have a history of rheumatic fever. he does have a history of alcohol use. The patient does not have a history of early familial atrial fibrillation or other arrhythmias.  he has a BMI of Body mass index is 26.45 kg/m.Marland Kitchen Filed Weights   07/09/21 1434  Weight: 96 kg    Family History  Problem Relation Age of Onset   Multiple myeloma Mother    COPD Father    Colon cancer Father    Suicidality Brother    CVA Brother      Atrial Fibrillation Management history:  Previous antiarrhythmic drugs: none Previous cardioversions: none Previous ablations: 2019 flutter CHADS2VASC score: 5 Anticoagulation history: Xarelto    Past Medical History:  Diagnosis Date   Arthritis    hands / thumbs   Atrial flutter ([place])    in setting of URI now on Xarelto for a CHADS2VASC score of 4   BPH  (benign prostatic hyperplasia)    Colon polyp    Fatigue due to excessive exertion 03/18/2014   GERD (gastroesophageal reflux disease)    Gout    Hypertension    Paroxysmal supraventricular tachycardia ([place])    Dr Radford Pax   Pure hypercholesterolemia    Right bundle branch block    normal coronary arteries    Stroke [facility]) 2009   TIA   Syncope and collapse    ? 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   Past Surgical History:  Procedure Laterality Date   A-FLUTTER ABLATION N/A 04/17/2018   Procedure: A-FLUTTER ABLATION;  Surgeon: Evans Lance, MD;  Location: [facility];  Service: Cardiovascular;  Laterality: N/A;   CARDIAC CATHETERIZATION     normal coronary arteries   HOLEP-LASER ENUCLEATION OF THE PROSTATE WITH MORCELLATION N/A 02/07/2018   Procedure: HOLEP-LASER ENUCLEATION OF THE PROSTATE WITH MORCELLATION;  Surgeon: Billey Co, MD;  Location: [facility] ORS;  Service: Urology;  Laterality: N/A;   LEFT HEART CATHETERIZATION WITH CORONARY ANGIOGRAM N/A 03/20/2014   Procedure: LEFT HEART CATHETERIZATION WITH CORONARY ANGIOGRAM;  Surgeon: Burnell Blanks, MD;  Location: [facility] CATH LAB;  Service: Cardiovascular;  Laterality: N/A;   TONSILLECTOMY      Current Outpatient Medications  Medication Sig Dispense Refill   acetaminophen (TYLENOL) 500 MG tablet 1 tablet as needed for mild pain     allopurinol (ZYLOPRIM) 300 MG tablet Take 300 mg by mouth at bedtime.   5  apixaban (ELIQUIS) 5 MG TABS tablet Take 1 tablet (5 mg total) by mouth 2 (two) times daily. 60 tablet 3   atorvastatin (LIPITOR) 10 MG tablet Take 1 tablet (10 mg total) by mouth daily. 90 tablet 3   cetirizine (ZYRTEC) 10 MG tablet Take 10 mg by mouth as needed.     diltiazem (CARDIZEM CD) 180 MG 24 hr capsule Take 1 capsule (180 mg total) by mouth daily. Please keep upcoming appt in April 2023 with Dr. Radford Pax before anymore refills. Thank you Final Attempt 90 capsule 0   docusate  sodium (COLACE) 100 MG capsule Take 100 mg by mouth every evening.      fluticasone (FLONASE) 50 MCG/ACT nasal spray Place 2 sprays into both nostrils as needed.     ibuprofen (ADVIL,MOTRIN) 200 MG tablet Take 200 mg by mouth as needed for moderate pain (for pain).     loratadine (CLARITIN) 10 MG tablet Take 10 mg by mouth daily as needed (for allergies or congestion).      Multiple Vitamins-Minerals (PRESERVISION AREDS PO) AREDS # 2-Taking 2 soft gels by mouth daily     omeprazole (PRILOSEC) 20 MG capsule Take 1 capsule (20 mg total) by mouth daily. 90 capsule 3   Probiotic Product (PROBIOTIC DAILY PO) Take 1 tablet by mouth every other day.     No current facility-administered medications for this encounter.    Allergies  Allergen Reactions   Amoxicillin Hives and Rash    Has patient had a PCN reaction causing immediate rash, facial/tongue/throat swelling, SOB or lightheadedness with hypotension: Yes Has patient had a PCN reaction causing severe rash involving mucus membranes or skin necrosis: Yes Has patient had a PCN reaction that required hospitalization: No Has patient had a PCN reaction occurring within the last 10 years: No If all of the above answers are "NO", then may proceed with Cephalosporin use.    Shrimp [Shellfish Allergy] Hives   Zantac [Ranitidine Hcl] Hives    Social History   Socioeconomic History   Marital status: Married    Spouse name: margaret   Number of children: Not on file   Years of education: Not on file   Highest education level: Not on file  Occupational History    Comment: retired  Tobacco Use   Smoking status: Former    Types: Landscape architect, Cigarettes    Quit date: 1955    Years since quitting: 68.2   Smokeless tobacco: Never  Vaping Use   Vaping Use: Never used  Substance and Sexual Activity   Alcohol use: Yes    Alcohol/week: 8.0 standard drinks    Types: 8 Glasses of wine per week   Drug use: No   Sexual activity: Not on file  Other  Topics Concern   Not on file  Social History Narrative   Current smoker cigar (occasional)   Alcohol -yes wine 3x a week   No caffeine    No recreational drug use   Diet yes- diet no salt ,no caffeine   Occupation Corporate investment banker status   Children 2   Social Determinants of Radio broadcast assistant Strain: Not on file  Food Insecurity: Not on file  Transportation Needs: Not on file  Physical Activity: Not on file  Stress: Not on file  Social Connections: Not on file  Intimate Partner Violence: Not on file     ROS- All systems are reviewed and negative except as per the HPI above.  Physical Exam: Vitals:  07/09/21 1434  BP: (!) 142/76  Pulse: 68  Weight: 96 kg  Height: _0  (1.905 m)    GEN- The patient is a well appearing elderly male, alert and oriented x 3 today.   Head- normocephalic, atraumatic Eyes-  Sclera clear, conjunctiva pink Ears- hearing intact Oropharynx- clear Neck- supple  Lungs- Clear to ausculation bilaterally, normal work of breathing Heart- Regular rate and rhythm, no murmurs, rubs or gallops  GI- soft, NT, ND, + BS Extremities- no clubbing, cyanosis, or edema MS- no significant deformity or atrophy Skin- no rash or lesion Psych- euthymic mood, full affect Neuro- strength and sensation are intact  Wt Readings from Last 3 Encounters:  07/09/21 96 kg  12/24/19 95.3 kg  03/26/19 95.3 kg    EKG today demonstrates  SR, RBBB, LPFB Vent. rate 68 BPM PR interval 134 ms QRS duration 150 ms QT/QTcB 438/465 ms  Echo 07/14/16 demonstrated  Left ventricle: The cavity size was normal. There was mild    concentric hypertrophy. Systolic function was normal. The    estimated ejection fraction was in the range of 55% to 60%. Wall    motion was normal; there were no regional wall motion    abnormalities. Doppler parameters are consistent with abnormal    left ventricular relaxation (grade 1 diastolic dysfunction).    Doppler  parameters are consistent with indeterminate ventricular    filling pressure.  - Aortic valve: Transvalvular velocity was within the normal range.    There was no stenosis. There was no regurgitation.  - Mitral valve: Transvalvular velocity was within the normal range.    There was no evidence for stenosis. There was no regurgitation.  - Left atrium: The atrium was moderately dilated.  - Right ventricle: The cavity size was mildly dilated. Wall    thickness was normal. Systolic function was normal.  - Right atrium: The atrium was moderately dilated.  - Atrial septum: No defect or patent foramen ovale was identified    by color flow Doppler.  - Tricuspid valve: There was mild regurgitation.  - Pulmonary arteries: Systolic pressure was within the normal    range. PA peak pressure: 29 mm Hg (S).  Epic records are reviewed at length today  CHA2DS2-VASc Score = 5  The patient's score is based upon: CHF History: 0 HTN History: 1 Diabetes History: 0 Stroke History: 2 Vascular Disease History: 0 Age Score: 2 Gender Score: 0       ASSESSMENT AND PLAN: 1. Paroxysmal Atrial Fibrillation/atrial flutter The patient's CHA2DS2-VASc score is 5, indicating a 7.2% annual risk of stroke.   S/p flutter ablation 2019 Patient back in SR. General education about afib provided and questions answered. We also discussed his stroke risk and the risks and benefits of anticoagulation. Will start Eliquis 5 mg BID. Labs in Care everywhere reviewed. He had nosebleeds on Xarelto. Continue diltiazem 180 mg daily We discussed smart device technology for monitoring afib at home.   2. Secondary Hypercoagulable State (ICD10:  D68.69) The patient is at significant risk for stroke/thromboembolism based upon his CHA2DS2-VASc Score of 5.  Start Apixaban (Eliquis).   3. HTN Stable, no changes today.   Follow up in the AF clinic in one month.    [facility] [address] [place], [place] [address] [phone] 07/09/2021 3:46 PM

## 2021-07-14 ENCOUNTER — Other Ambulatory Visit (HOSPITAL_COMMUNITY): Payer: Self-pay | Admitting: *Deleted

## 2021-07-14 MED ORDER — APIXABAN 5 MG PO TABS: 5.0000 mg | ORAL_TABLET | Freq: Two times a day (BID) | ORAL | 1 refills | Status: DC

## 2021-07-16 DIAGNOSIS — H2511 Age-related nuclear cataract, right eye: Secondary | ICD-10-CM | POA: Diagnosis not present

## 2021-07-27 ENCOUNTER — Other Ambulatory Visit: Payer: Self-pay

## 2021-07-27 ENCOUNTER — Ambulatory Visit (HOSPITAL_COMMUNITY)
Admission: RE | Admit: 2021-07-27 | Discharge: 2021-07-27 | Disposition: A | Discharge status: Home / Self Care | Payer: Medicare Other | Source: Ambulatory Visit | Attending: Physician Assistant | Admitting: Physician Assistant

## 2021-07-27 DIAGNOSIS — I4510 Unspecified right bundle-branch block: Secondary | ICD-10-CM | POA: Diagnosis not present

## 2021-07-27 DIAGNOSIS — I4891 Unspecified atrial fibrillation: Secondary | ICD-10-CM | POA: Insufficient documentation

## 2021-07-27 DIAGNOSIS — Z87891 Personal history of nicotine dependence: Secondary | ICD-10-CM | POA: Diagnosis not present

## 2021-07-27 DIAGNOSIS — I480 Paroxysmal atrial fibrillation: Secondary | ICD-10-CM | POA: Diagnosis not present

## 2021-07-27 LAB — ECHOCARDIOGRAM COMPLETE
AR max vel: 3.1 cm2
AV Area VTI: 3.9 cm2
AV Area mean vel: 2.97 cm2
AV Mean grad: 1 mmHg
AV Peak grad: 2.7 mmHg
Ao pk vel: 0.82 m/s
Area-P 1/2: 2.12 cm2
S' Lateral: 3.1 cm

## 2021-07-27 NOTE — Progress Notes (Signed)
?  Echocardiogram ?2D Echocardiogram has been performed. ? ?Logan Aguirre ?07/27/2021, 3:48 PM ?

## 2021-08-06 ENCOUNTER — Ambulatory Visit (HOSPITAL_COMMUNITY): Payer: Medicare Other | Admitting: Physician Assistant

## 2021-08-10 ENCOUNTER — Encounter (HOSPITAL_COMMUNITY): Payer: Self-pay | Admitting: Physician Assistant

## 2021-08-10 ENCOUNTER — Ambulatory Visit (HOSPITAL_COMMUNITY)
Admission: RE | Admit: 2021-08-10 | Discharge: 2021-08-10 | Disposition: A | Discharge status: Home / Self Care | Payer: Medicare Other | Source: Ambulatory Visit | Attending: Physician Assistant | Admitting: Physician Assistant

## 2021-08-10 VITALS — BP 124/74 | HR 75 | Ht 75.0 in | Wt 209.6 lb

## 2021-08-10 DIAGNOSIS — I4892 Unspecified atrial flutter: Secondary | ICD-10-CM | POA: Diagnosis not present

## 2021-08-10 DIAGNOSIS — Z8673 Personal history of transient ischemic attack (TIA), and cerebral infarction without residual deficits: Secondary | ICD-10-CM | POA: Diagnosis not present

## 2021-08-10 DIAGNOSIS — D6869 Other thrombophilia: Secondary | ICD-10-CM | POA: Insufficient documentation

## 2021-08-10 DIAGNOSIS — I480 Paroxysmal atrial fibrillation: Secondary | ICD-10-CM | POA: Insufficient documentation

## 2021-08-10 DIAGNOSIS — Z7901 Long term (current) use of anticoagulants: Secondary | ICD-10-CM | POA: Insufficient documentation

## 2021-08-10 DIAGNOSIS — I10 Essential (primary) hypertension: Secondary | ICD-10-CM | POA: Diagnosis not present

## 2021-08-10 LAB — CBC
HCT: 45.9 % (ref 39.0–52.0)
Hemoglobin: 15.2 g/dL (ref 13.0–17.0)
MCH: 31.5 pg (ref 26.0–34.0)
MCHC: 33.1 g/dL (ref 30.0–36.0)
MCV: 95.2 fL (ref 80.0–100.0)
Platelets: 195 10*3/uL (ref 150–400)
RBC: 4.82 MIL/uL (ref 4.22–5.81)
RDW: 13.8 % (ref 11.5–15.5)
WBC: 6.9 10*3/uL (ref 4.0–10.5)
nRBC: 0 % (ref 0.0–0.2)

## 2021-08-10 LAB — BASIC METABOLIC PANEL
Anion gap: 7 (ref 5–15)
BUN: 21 mg/dL (ref 8–23)
CO2: 24 mmol/L (ref 22–32)
Calcium: 8.5 mg/dL — ABNORMAL LOW (ref 8.9–10.3)
Chloride: 108 mmol/L (ref 98–111)
Creatinine, Ser: 0.94 mg/dL (ref 0.61–1.24)
GFR, Estimated: >60 mL/min (ref >60)
Glucose, Bld: 92 mg/dL (ref 70–99)
Potassium: 4.1 mmol/L (ref 3.5–5.1)
Sodium: 139 mmol/L (ref 135–145)

## 2021-08-10 NOTE — Progress Notes (Signed)
? ? ?Primary Care Physician: Jonathon Jordan, MD ?Primary Cardiologist: Dr Radford Pax  ?Primary Electrophysiologist: Dr Lovena Le ?Referring Physician: Dr Radford Pax ? ? ?Logan Aguirre is a 81 y.o. male with a history of HTN, TIA, atrial flutter, atrial fibrillation who presents for follow up in the [facility].  The patient was initially diagnosed with atrial flutter and underwent ablation 04/17/18 with Dr Lovena Le. He had done well until 07/02/21 when he was found to be in atrial fibrillation during cataract surgery. He was asymptomatic and rate controlled. Patient has a CHADS2VASC score of 5. He was back in SR at follow up on 07/09/21. He does consume alcohol daily. He denies significant snoring.  ? ?On follow up today, patient reports he has done well since his last visit. He remains in SR today. He denies any bleeding issues on anticoagulation.  ? ?Today, he denies symptoms of palpitations, chest pain, shortness of breath, orthopnea, PND, lower extremity edema, dizziness, presyncope, syncope, snoring, daytime somnolence, bleeding, or neurologic sequela. The patient is tolerating medications without difficulties and is otherwise without complaint today.  ? ? ?Atrial Fibrillation Risk Factors: ? ?he does not have symptoms or diagnosis of sleep apnea. ?he does not have a history of rheumatic fever. ?he does have a history of alcohol use. ?The patient does not have a history of early familial atrial fibrillation or other arrhythmias. ? ?he has a BMI of Body mass index is 26.2 kg/m?Marland KitchenMarland Kitchen ?Filed Weights  ? 08/10/21 1503  ?Weight: 95.1 kg  ? ? ? ?Family History  ?Problem Relation Age of Onset  ? Multiple myeloma Mother   ? COPD Father   ? Colon cancer Father   ? Suicidality Brother   ? CVA Brother   ? ? ? ?Atrial Fibrillation Management history: ? ?Previous antiarrhythmic drugs: none ?Previous cardioversions: none ?Previous ablations: 2019 flutter ?CHADS2VASC score: 5 ?Anticoagulation history: Xarelto  (nosebleeds), Eliquis ? ? ?Past Medical History:  ?Diagnosis Date  ? Arthritis   ? hands / thumbs  ? Atrial flutter ([place])   ? in setting of URI now on Xarelto for a CHADS2VASC score of 4  ? BPH (benign prostatic hyperplasia)   ? Colon polyp   ? Fatigue due to excessive exertion 03/18/2014  ? GERD (gastroesophageal reflux disease)   ? Gout   ? Hypertension   ? Paroxysmal supraventricular tachycardia ([place])   ? Dr Radford Pax  ? Pure hypercholesterolemia   ? Right bundle branch block   ? normal coronary arteries   ? Stroke [facility]) 2009  ? TIA  ? Syncope and collapse   ? ? TIA   ? TIA (transient ischemic attack)   ? 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 ?Past Surgical History:  ?Procedure Laterality Date  ? A-FLUTTER ABLATION N/A 04/17/2018  ? Procedure: A-FLUTTER ABLATION;  Surgeon: Evans Lance, MD;  Location: [place] CV LAB;  Service: Cardiovascular;  Laterality: N/A;  ? CARDIAC CATHETERIZATION    ? normal coronary arteries  ? HOLEP-LASER ENUCLEATION OF THE PROSTATE WITH MORCELLATION N/A 02/07/2018  ? Procedure: HOLEP-LASER ENUCLEATION OF THE PROSTATE WITH MORCELLATION;  Surgeon: Billey Co, MD;  Location: [facility] ORS;  Service: Urology;  Laterality: N/A;  ? LEFT HEART CATHETERIZATION WITH CORONARY ANGIOGRAM N/A 03/20/2014  ? Procedure: LEFT HEART CATHETERIZATION WITH CORONARY ANGIOGRAM;  Surgeon: Burnell Blanks, MD;  Location: [facility] CATH LAB;  Service: Cardiovascular;  Laterality: N/A;  ? TONSILLECTOMY    ? ? ?Current Outpatient Medications  ?Medication Sig Dispense  Refill  ? acetaminophen (TYLENOL) 500 MG tablet 1 tablet as needed for mild pain    ? allopurinol (ZYLOPRIM) 300 MG tablet Take 300 mg by mouth at bedtime.   5  ? apixaban (ELIQUIS) 5 MG TABS tablet Take 1 tablet (5 mg total) by mouth 2 (two) times daily. 180 tablet 1  ? atorvastatin (LIPITOR) 10 MG tablet Take 1 tablet (10 mg total) by mouth daily. 90 tablet 3  ? cetirizine (ZYRTEC) 10 MG tablet Take 10 mg by mouth as needed.    ? diltiazem (CARDIZEM CD) 180 MG  24 hr capsule Take 1 capsule (180 mg total) by mouth daily. Please keep upcoming appt in April 2023 with Dr. Radford Pax before anymore refills. Thank you Final Attempt 90 capsule 0  ? docusate sodium (COLACE) 100 MG capsule Take 100 mg by mouth every evening.     ? fluticasone (FLONASE) 50 MCG/ACT nasal spray Place 2 sprays into both nostrils as needed.    ? ibuprofen (ADVIL,MOTRIN) 200 MG tablet Take 200 mg by mouth as needed for moderate pain (for pain).    ? loratadine (CLARITIN) 10 MG tablet Take 10 mg by mouth daily as needed (for allergies or congestion).     ? Multiple Vitamins-Minerals (PRESERVISION AREDS PO) AREDS # 2-Taking 2 soft gels by mouth daily    ? omeprazole (PRILOSEC) 20 MG capsule Take 1 capsule (20 mg total) by mouth daily. 90 capsule 3  ? Probiotic Product (PROBIOTIC DAILY PO) Take 1 tablet by mouth every other day.    ? ?No current facility-administered medications for this encounter.  ? ? ?Allergies  ?Allergen Reactions  ? Amoxicillin Hives and Rash  ?  Has patient had a PCN reaction causing immediate rash, facial/tongue/throat swelling, SOB or lightheadedness with hypotension: Yes ?Has patient had a PCN reaction causing severe rash involving mucus membranes or skin necrosis: Yes ?Has patient had a PCN reaction that required hospitalization: No ?Has patient had a PCN reaction occurring within the last 10 years: No ?If all of the above answers are "NO", then may proceed with Cephalosporin use. ?  ? Shrimp [Shellfish Allergy] Hives  ? Zantac [Ranitidine Hcl] Hives  ? ? ?Social History  ? ?Socioeconomic History  ? Marital status: Married  ?  Spouse name: margaret  ? Number of children: Not on file  ? Years of education: Not on file  ? Highest education level: Not on file  ?Occupational History  ?  Comment: retired  ?Tobacco Use  ? Smoking status: Former  ?  Types: Cigars, Cigarettes  ?  Quit date: 90  ?  Years since quitting: 68.3  ? Smokeless tobacco: Never  ? Tobacco comments:  ?  Former smoker  08/10/21  ?Vaping Use  ? Vaping Use: Never used  ?Substance and Sexual Activity  ? Alcohol use: Yes  ?  Alcohol/week: 3.0 - 5.0 standard drinks  ?  Types: 3 - 5 Glasses of wine per week  ?  Comment: 3-5 glasses a week 08/10/21  ? Drug use: No  ? Sexual activity: Not on file  ?Other Topics Concern  ? Not on file  ?Social History Narrative  ? Current smoker cigar (occasional)  ? Alcohol -yes wine 3x a week  ? No caffeine   ? No recreational drug use  ? Diet yes- diet no salt ,no caffeine  ? Occupation principal,teacher  ? Martial status  ? Children 2  ? ?Social Determinants of Health  ? ?Financial Resource Strain: Not  on file  ?Food Insecurity: Not on file  ?Transportation Needs: Not on file  ?Physical Activity: Not on file  ?Stress: Not on file  ?Social Connections: Not on file  ?Intimate Partner Violence: Not on file  ? ? ? ?ROS- All systems are reviewed and negative except as per the HPI above. ? ?Physical Exam: ?Vitals:  ? 08/10/21 1503  ?BP: 124/74  ?Pulse: 75  ?Weight: 95.1 kg  ?Height: 6' 3" (1.905 m)  ? ? ? ?GEN- The patient is a well appearing elderly male, alert and oriented x 3 today.   ?HEENT-head normocephalic, atraumatic, sclera clear, conjunctiva pink, hearing intact, trachea midline. ?Lungs- Clear to ausculation bilaterally, normal work of breathing ?Heart- Regular rate and rhythm, no murmurs, rubs or gallops  ?GI- soft, NT, ND, + BS ?Extremities- no clubbing, cyanosis, or edema ?MS- no significant deformity or atrophy ?Skin- no rash or lesion ?Psych- euthymic mood, full affect ?Neuro- strength and sensation are intact ? ? ?Wt Readings from Last 3 Encounters:  ?08/10/21 95.1 kg  ?07/09/21 96 kg  ?12/24/19 95.3 kg  ? ? ?EKG today demonstrates  ?SR, RBBB, PACs ?Vent. rate 75 BPM ?PR interval 136 ms ?QRS duration 136 ms ?QT/QTcB 420/469 ms ? ?Echo 07/14/16 demonstrated  ?Left ventricle: The cavity size was normal. There was mild  ?  concentric hypertrophy. Systolic function was normal. The  ?  estimated  ejection fraction was in the range of 55% to 60%. Wall  ?  motion was normal; there were no regional wall motion  ?  abnormalities. Doppler parameters are consistent with abnormal  ?  left ventricular relax

## 2021-08-11 NOTE — Progress Notes (Addendum)
?Cardiology Office Note:   ? ?Date:  08/12/2021  ? ?ID:  GREGOIRE Aguirre, DOB 04/17/1941, MRN [id number] ? ?PCP:  Jonathon Jordan, MD  ?Cardiologist:  Fransico Him, MD   ? ?Referring MD: Jonathon Jordan, MD  ? ?Chief Complaint  ?Patient presents with  ? Follow-up  ?  RBBB, SVT, atrial flutter, PAF, HTN  ? ? ?History of Present Illness:   ? ?Logan Aguirre is a 81 y.o. male with a hx of normal coronary arteries, chronic RBBB, SVT, HTN, atrial flutter on Xarelto, CHA2DS2-VASc equals 4, prior TIA.  He was recently found on 07/02/2021 to be in afib at time of his cataract surgery and was seen in afib clinic and started on Eliquis 38m BID for CHADS2VCASC score of 5.  He was back in NSR at last OV in afib clinic.  ? ?He is here today for followup and is doing well.  He denies any chest pain or pressure, SOB, DOE, PND, orthopnea, LE edema, dizziness, palpitations or syncope. He is compliant with his meds and is tolerating meds with no SE.    ? ?Past Medical History:  ?Diagnosis Date  ? Arthritis   ? hands / thumbs  ? [place]rial [place]   ? in setting of URI now on Xarelto for a CHADS2VASC score of 4  ? BPH (benign prostatic hyperplasia)   ? Colon polyp   ? Fatigue due to excessive exertion 03/18/2014  ? GERD (gastroesophageal reflux disease)   ? Gout   ? Hypertension   ? [place]   ? [place]   ? Dr TRadford Pax ? Pure hypercholesterolemia   ? Right bundle branch block   ? normal coronary arteries   ? Stroke ([facility] 2009  ? TIA  ? Syncope and collapse   ? ? TIA   ? TIA (transient ischemic attack)   ? 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 ? ?Past Surgical History:  ?Procedure Laterality Date  ? A-FLUTTER ABLATION N/A 04/17/2018  ? Procedure: A-FLUTTER ABLATION;  Surgeon: TEvans Lance MD;  [facility];  Service: Cardiovascular;  Laterality: N/A;  ? CARDIAC CATHETERIZATION    ? normal coronary arteries  ? HOLEP-LASER ENUCLEATION OF THE PROSTATE WITH  MORCELLATION N/A 02/07/2018  ? Procedure: HOLEP-LASER ENUCLEATION OF THE PROSTATE WITH MORCELLATION;  Surgeon: SBilley Co MD;  Location: [facility] ORS;  Service: Urology;  Laterality: N/A;  ? LEFT HEART CATHETERIZATION WITH CORONARY ANGIOGRAM N/A 03/20/2014  ? Procedure: LEFT HEART CATHETERIZATION WITH CORONARY ANGIOGRAM;  Surgeon: CBurnell Blanks MD;  [facility];  Service: Cardiovascular;  Laterality: N/A;  ? TONSILLECTOMY    ? ? ?Current Medications: ?Current Meds  ?Medication Sig  ? acetaminophen (TYLENOL) 500 MG tablet 1 tablet as needed for mild pain  ? allopurinol (ZYLOPRIM) 300 MG tablet Take 300 mg by mouth at bedtime.   ? apixaban (ELIQUIS) 5 MG TABS tablet Take 1 tablet (5 mg total) by mouth 2 (two) times daily.  ? atorvastatin (LIPITOR) 10 MG tablet Take 1 tablet (10 mg total) by mouth daily.  ? cetirizine (ZYRTEC) 10 MG tablet Take 10 mg by mouth as needed.  ? diltiazem (CARDIZEM CD) 180 MG 24 hr capsule Take 1 capsule (180 mg total) by mouth daily. Please keep upcoming appt in April 2023 with Dr. TRadford Paxbefore anymore refills. Thank you Final Attempt  ? docusate sodium (COLACE) 100 MG capsule Take 100 mg by mouth as  needed.  ? fluticasone (FLONASE) 50 MCG/ACT nasal spray Place 2 sprays into both nostrils as needed.  ? ibuprofen (ADVIL,MOTRIN) 200 MG tablet Take 200 mg by mouth as needed for moderate pain (for pain).  ? loratadine (CLARITIN) 10 MG tablet Take 10 mg by mouth daily as needed (for allergies or congestion).   ? Multiple Vitamins-Minerals (PRESERVISION AREDS PO) AREDS # 2-Taking 2 soft gels by mouth daily  ? omeprazole (PRILOSEC) 20 MG capsule Take 1 capsule (20 mg total) by mouth daily.  ? Probiotic Product (PROBIOTIC DAILY PO) Take 1 tablet by mouth every other day.  ?  ? ?Allergies:   Amoxicillin, Shrimp [shellfish allergy], and Zantac [ranitidine hcl]  ? ?Social History  ? ?Socioeconomic History  ? Marital status: Married  ?  Spouse name: Logan Aguirre  ? Number of  children: Not on file  ? Years of education: Not on file  ? Highest education level: Not on file  ?Occupational History  ?  Comment: retired  ?Tobacco Use  ? Smoking status: Former  ?  Types: Cigars, Cigarettes  ?  Quit date: 71  ?  Years since quitting: 68.3  ? Smokeless tobacco: Never  ? Tobacco comments:  ?  Former smoker 08/10/21  ?Vaping Use  ? Vaping Use: Never used  ?Substance and Sexual Activity  ? Alcohol use: Yes  ?  Alcohol/week: 3.0 - 5.0 standard drinks  ?  Types: 3 - 5 Glasses of wine per week  ?  Comment: 3-5 glasses a week 08/10/21  ? Drug use: No  ? Sexual activity: Not on file  ?Other Topics Concern  ? Not on file  ?Social History Narrative  ? Current smoker cigar (occasional)  ? Alcohol -yes wine 3x a week  ? No caffeine   ? No recreational drug use  ? Diet yes- diet no salt ,no caffeine  ? Occupation principal,teacher  ? Martial status  ? Children 2  ? ?Social Determinants of Health  ? ?Financial Resource Strain: Not on file  ?Food Insecurity: Not on file  ?Transportation Needs: Not on file  ?Physical Activity: Not on file  ?Stress: Not on file  ?Social Connections: Not on file  ?  ? ?Family History: ?The patient's family history includes COPD in his father; CVA in his brother; Colon cancer in his father; Multiple myeloma in his mother; Suicidality in his brother. ? ?ROS:   ?Please see the history of present illness.    ?ROS  ?All other systems reviewed and negative.  ? ?EKGs/Labs/Other Studies Reviewed:   ? ?The following studies were reviewed today: ?none ? ?EKG:  EKG is not ordered today  ?Recent Labs: ?08/10/2021: BUN 21; Creatinine, Ser 0.94; Hemoglobin 15.2; Platelets 195; Potassium 4.1; Sodium 139  ? ?Recent Lipid Panel ?   ?Component Value Date/Time  ? CHOL  11/07/2007 2355  ?  173        ?ATP III CLASSIFICATION: ? <200     mg/dL   Desirable ? 200-239  mg/dL   Borderline High ? >=240    mg/dL   High  ? TRIG 144 11/07/2007 2355  ? HDL 27 (L) 11/07/2007 2355  ? CHOLHDL 6.4 11/07/2007 2355   ? VLDL 29 11/07/2007 2355  ? [place] (H) 11/07/2007 2355  ?  117        ?Total Cholesterol/HDL:CHD Risk ?Coronary Heart Disease Risk Table ?                    Men  Women ? 1/2 Average Risk   3.4   3.3  ? ? ?Physical Exam:   ? ?VS:  BP 130/77   Pulse 76   Ht _0  (1.905 m)   Wt 209 lb (94.8 kg)   SpO2 99%   BMI 26.12 kg/m?    ? ?Wt Readings from Last 3 Encounters:  ?08/12/21 209 lb (94.8 kg)  ?08/10/21 209 lb 9.6 oz (95.1 kg)  ?07/09/21 211 lb 9.6 oz (96 kg)  ?  ?GEN: Well nourished, well developed in no acute distress ?HEENT: Normal ?NECK: No JVD; No carotid bruits ?LYMPHATICS: No lymphadenopathy ?CARDIAC:RRR, no murmurs, rubs, gallops ?RESPIRATORY:  Clear to auscultation without rales, wheezing or rhonchi  ?ABDOMEN: Soft, non-tender, non-distended ?MUSCULOSKELETAL:  No edema; No deformity  ?SKIN: Warm and dry ?NEUROLOGIC:  Alert and oriented x 3 ?PSYCHIATRIC:  Normal affect   ?ASSESSMENT:   ? ?1. Orthostatic hypotension   ?2. PAF (paroxysmal atrial fibrillation) ([place])   ?3. Atrial flutter, unspecified type ([place])   ?4. RBBB   ?5. Benign essential HTN   ? ?PLAN:   ? ?In order of problems listed above: ? ?1. Orthostatic hypotension ?-felt secondary in the past to decreased po intake ?-He denies any dizziness or syncope ? ?2.  Paroxysmal atrial flutter/PAF ?-followed by Dr. Lovena Le in EP s/p aflutter ablation ?-developed PAF in March 2023 at time of cataract surgery ?-CHADS2VAS score is 5 (age >38, TIA, HTN) ?-he is maintaining NSR and denies any palpitations ?-continue prescription drug management with Cardizem CD 115m daily and Eliquis 573mBID with PRN refills.  ?-I have personally reviewed and interpreted outside labs performed by patient's PCP which showed SCr 1.14, K+ 4.5 and Hbg 15 on 04/23/2021  ? ?3.  RBBB  ?-noted as far back as November 2014 with nuclear stress test in 2015 showing no ischemia. ? ?4.  HTN ?-BP controlled on exam today ?-continue Cardizem CD 18051maily with PRN  refills ? ? ?Medication Adjustments/Labs and Tests Ordered: ?Current medicines are reviewed at length with the patient today.  Concerns regarding medicines are outlined above.  ?No orders of the defined types were placed in this encounter. ?

## 2021-08-12 ENCOUNTER — Encounter: Payer: Self-pay | Admitting: Cardiology

## 2021-08-12 ENCOUNTER — Ambulatory Visit (INDEPENDENT_AMBULATORY_CARE_PROVIDER_SITE_OTHER): Payer: Medicare Other | Admitting: Cardiology

## 2021-08-12 ENCOUNTER — Telehealth: Payer: Self-pay | Admitting: Pharmacist Clinician (PhC)/ Clinical Pharmacy Specialist

## 2021-08-12 VITALS — BP 130/77 | HR 76 | Ht 75.0 in | Wt 209.0 lb

## 2021-08-12 DIAGNOSIS — I4510 Unspecified right bundle-branch block: Secondary | ICD-10-CM

## 2021-08-12 DIAGNOSIS — I480 Paroxysmal atrial fibrillation: Secondary | ICD-10-CM

## 2021-08-12 DIAGNOSIS — I10 Essential (primary) hypertension: Secondary | ICD-10-CM | POA: Diagnosis not present

## 2021-08-12 DIAGNOSIS — I4892 Unspecified atrial flutter: Secondary | ICD-10-CM

## 2021-08-12 DIAGNOSIS — I951 Orthostatic hypotension: Secondary | ICD-10-CM

## 2021-08-12 MED ORDER — APIXABAN 5 MG PO TABS: 5.0000 mg | ORAL_TABLET | Freq: Two times a day (BID) | ORAL | 3 refills | Status: DC

## 2021-08-12 MED ORDER — DILTIAZEM HCL ER COATED BEADS 180 MG PO CP24: 180.0000 mg | ORAL_CAPSULE | Freq: Every day | ORAL | 3 refills | Status: DC

## 2021-08-12 NOTE — Addendum Note (Signed)
Addended by: Antonieta Iba on: 08/12/2021 08:24 AM ? ? Modules accepted: Orders ? ?

## 2021-08-12 NOTE — Telephone Encounter (Signed)
Spoke with patient.  He has separate PDP with $505 up front deductible.  Explained how plan worked, after this price drops to $42/month.  Patient able to afford knowing this.  Answered all questions ?

## 2021-08-12 NOTE — Telephone Encounter (Signed)
-----   Message from Sueanne Margarita, MD sent at 08/12/2021  9:57 AM EDT ----- ?Regarding: RE: anti-coag ?I think that is a great idea ?----- Message ----- ?From: Rockne Menghini, RPH-CPP ?Sent: 08/12/2021   9:51 AM EDT ?To: Sueanne Margarita, MD, Deboraha Sprang, MD ?Subject: anti-coag                                     ? ?Drs Caryl Comes and Radford Pax ? ?Would you have any objection if we reached out to Mr Sciuto to see if he has any interest in joining the OCEANIC-AF study.  It's looking at a new Factor XI inhibitor and comparing to Eliquis.  Mr Milewski is having problems affording, this would give him free drug for the next "9-33 months".  It's being conducted by [facility], Medication Management. ? ?Thanks,  ? ?Erasmo Downer ?----- Message ----- ?From: Antonieta Iba, RN ?Sent: 08/12/2021   8:27 AM EDT ?To: Deliah Boston Via, LPN, [facility] ? ?Patient is having trouble affording his Eliquis. He was taken off of xarelto due to bleeding issues. Can you all look into to see if he would qualify for patient assistance? ?Carly  ? ? ? ?

## 2021-08-12 NOTE — Patient Instructions (Addendum)

## 2021-10-15 ENCOUNTER — Other Ambulatory Visit (HOSPITAL_COMMUNITY): Payer: Self-pay | Admitting: *Deleted

## 2021-10-15 MED ORDER — APIXABAN 5 MG PO TABS: 5.0000 mg | ORAL_TABLET | Freq: Two times a day (BID) | ORAL | 2 refills | Status: DC

## 2021-10-22 DIAGNOSIS — I4892 Unspecified atrial flutter: Secondary | ICD-10-CM | POA: Diagnosis not present

## 2021-10-22 DIAGNOSIS — R7301 Impaired fasting glucose: Secondary | ICD-10-CM | POA: Diagnosis not present

## 2021-10-22 DIAGNOSIS — I700 Atherosclerosis of aorta: Secondary | ICD-10-CM | POA: Diagnosis not present

## 2021-10-22 DIAGNOSIS — K529 Noninfective gastroenteritis and colitis, unspecified: Secondary | ICD-10-CM | POA: Diagnosis not present

## 2021-10-22 DIAGNOSIS — I4510 Unspecified right bundle-branch block: Secondary | ICD-10-CM | POA: Diagnosis not present

## 2021-10-23 DIAGNOSIS — K529 Noninfective gastroenteritis and colitis, unspecified: Secondary | ICD-10-CM | POA: Diagnosis not present

## 2021-10-23 DIAGNOSIS — R7301 Impaired fasting glucose: Secondary | ICD-10-CM | POA: Diagnosis not present

## 2022-02-08 ENCOUNTER — Ambulatory Visit (HOSPITAL_COMMUNITY)
Admission: RE | Admit: 2022-02-08 | Discharge: 2022-02-08 | Disposition: A | Discharge status: Home / Self Care | Payer: Medicare Other | Source: Ambulatory Visit | Attending: Physician Assistant | Admitting: Physician Assistant

## 2022-02-08 ENCOUNTER — Encounter (HOSPITAL_COMMUNITY): Payer: Self-pay | Admitting: Nurse Practitioner

## 2022-02-08 VITALS — BP 140/72 | HR 64 | Wt 202.2 lb

## 2022-02-08 DIAGNOSIS — I480 Paroxysmal atrial fibrillation: Secondary | ICD-10-CM | POA: Diagnosis not present

## 2022-02-08 DIAGNOSIS — D6869 Other thrombophilia: Secondary | ICD-10-CM | POA: Diagnosis not present

## 2022-02-08 DIAGNOSIS — Z8673 Personal history of transient ischemic attack (TIA), and cerebral infarction without residual deficits: Secondary | ICD-10-CM | POA: Diagnosis not present

## 2022-02-08 DIAGNOSIS — I4892 Unspecified atrial flutter: Secondary | ICD-10-CM | POA: Diagnosis not present

## 2022-02-08 DIAGNOSIS — I10 Essential (primary) hypertension: Secondary | ICD-10-CM | POA: Diagnosis not present

## 2022-02-08 NOTE — Progress Notes (Signed)
Primary Care Physician: Jonathon Jordan, MD Primary Cardiologist: Dr Radford Pax  Primary Electrophysiologist: Dr Lovena Le Referring Physician: Dr Gaspar Skeeters is a 81 y.o. male with a history of HTN, TIA, atrial flutter, atrial fibrillation who presents for follow up in the [facility].  The patient was initially diagnosed with atrial flutter and underwent ablation 04/17/18 with Dr Lovena Le. He had done well until 07/02/21 when he was found to be in atrial fibrillation during cataract surgery. He was asymptomatic and rate controlled. Patient has a CHADS2VASC score of 5. He was back in SR at follow up on 07/09/21. He does consume alcohol daily. He denies significant snoring.   On follow up today, patient reports he has done well since his last visit. He remains in SR today. He denies any bleeding issues on anticoagulation.   F/u in afib clinic 02/08/22. He has been staying in [place]. Not sure if he would feel the afib if he had it. He has purchased a [employer] but has not set it up. No issues with anticoagulation.   Today, he denies symptoms of palpitations, chest pain, shortness of breath, orthopnea, PND, lower extremity edema, dizziness, presyncope, syncope, snoring, daytime somnolence, bleeding, or neurologic sequela. The patient is tolerating medications without difficulties and is otherwise without complaint today.    Atrial Fibrillation Risk Factors:  he does not have symptoms or diagnosis of sleep apnea. he does not have a history of rheumatic fever. he does have a history of alcohol use. The patient does not have a history of early familial atrial fibrillation or other arrhythmias.  he has a BMI of Body mass index is 25.27 kg/m.Marland Kitchen Filed Weights   02/08/22 1504  Weight: 91.7 kg     Family History  Problem Relation Age of Onset   Multiple myeloma Mother    COPD Father    Colon cancer Father    Suicidality Brother    CVA Brother      Atrial  Fibrillation Management history:  Previous antiarrhythmic drugs: none Previous cardioversions: none Previous ablations: 2019 flutter CHADS2VASC score: 5 Anticoagulation history: Xarelto (nosebleeds), Eliquis   Past Medical History:  Diagnosis Date   Arthritis    hands / thumbs   Atrial flutter ([place])    in setting of URI now on Xarelto for a CHADS2VASC score of 4   BPH (benign prostatic hyperplasia)    Colon polyp    Fatigue due to excessive exertion 03/18/2014   GERD (gastroesophageal reflux disease)    Gout    Hypertension    PAF (paroxysmal atrial fibrillation) (HCC)    Paroxysmal supraventricular tachycardia ([place])    Dr Radford Pax   Pure hypercholesterolemia    Right bundle branch block    normal coronary arteries    Stroke [facility]) 2009   TIA   Syncope and collapse    ? 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   Past Surgical History:  Procedure Laterality Date   A-FLUTTER ABLATION N/A 04/17/2018   Procedure: A-FLUTTER ABLATION;  Surgeon: Evans Lance, MD;  Location: [facility] CV [facility];  Service: Cardiovascular;  Laterality: N/A;   CARDIAC CATHETERIZATION     normal coronary arteries   HOLEP-LASER ENUCLEATION OF THE PROSTATE WITH MORCELLATION N/A 02/07/2018   Procedure: HOLEP-LASER ENUCLEATION OF THE PROSTATE WITH MORCELLATION;  Surgeon: Billey Co, MD;  Location: [facility] ORS;  Service: Urology;  Laterality: N/A;  LEFT HEART CATHETERIZATION WITH CORONARY ANGIOGRAM N/A 03/20/2014   Procedure: LEFT HEART CATHETERIZATION WITH CORONARY ANGIOGRAM;  Surgeon: Burnell Blanks, MD;  Location: [facility] CATH LAB;  Service: Cardiovascular;  Laterality: N/A;   TONSILLECTOMY      Current Outpatient Medications  Medication Sig Dispense Refill   acetaminophen (TYLENOL) 500 MG tablet 1 tablet as needed for mild pain     allopurinol (ZYLOPRIM) 300 MG tablet Take 300 mg by mouth at bedtime.   5   apixaban (ELIQUIS) 5 MG TABS tablet Take 1 tablet (5 mg total)  by mouth 2 (two) times daily. 180 tablet 2   atorvastatin (LIPITOR) 10 MG tablet Take 1 tablet (10 mg total) by mouth daily. 90 tablet 3   cetirizine (ZYRTEC) 10 MG tablet Take 10 mg by mouth as needed.     diltiazem (CARDIZEM CD) 180 MG 24 hr capsule Take 1 capsule (180 mg total) by mouth daily. 90 capsule 3   docusate sodium (COLACE) 100 MG capsule Take 100 mg by mouth as needed.     fluticasone (FLONASE) 50 MCG/ACT nasal spray Place 2 sprays into both nostrils as needed.     ibuprofen (ADVIL,MOTRIN) 200 MG tablet Take 200 mg by mouth as needed for moderate pain (for pain).     loratadine (CLARITIN) 10 MG tablet Take 10 mg by mouth daily as needed (for allergies or congestion).      Multiple Vitamins-Minerals (PRESERVISION AREDS PO) AREDS # 2-Taking 2 soft gels by mouth daily     omeprazole (PRILOSEC) 20 MG capsule Take 1 capsule (20 mg total) by mouth daily. 90 capsule 3   Probiotic Product (PROBIOTIC DAILY PO) Take 1 tablet by mouth daily.     psyllium (METAMUCIL) 58.6 % packet Take 1 packet by mouth daily.     No current facility-administered medications for this encounter.    Allergies  Allergen Reactions   Amoxicillin Hives and Rash    Has patient had a PCN reaction causing immediate rash, facial/tongue/throat swelling, SOB or lightheadedness with hypotension: Yes Has patient had a PCN reaction causing severe rash involving mucus membranes or skin necrosis: Yes Has patient had a PCN reaction that required hospitalization: No Has patient had a PCN reaction occurring within the last 10 years: No If all of the above answers are "NO", then may proceed with Cephalosporin use.    Shrimp [Shellfish Allergy] Hives   Zantac [Ranitidine Hcl] Hives    Social History   Socioeconomic History   Marital status: Married    Spouse name: margaret   Number of children: Not on file   Years of education: Not on file   Highest education level: Not on file  Occupational History    Comment:  retired  Tobacco Use   Smoking status: Former    Types: Cigars, Cigarettes    Quit date: 1955    Years since quitting: 68.8   Smokeless tobacco: Never   Tobacco comments:    Former smoker 08/10/21  Vaping Use   Vaping Use: Never used  Substance and Sexual Activity   Alcohol use: Yes    Alcohol/week: 3.0 - 5.0 standard drinks of alcohol    Types: 3 - 5 Glasses of wine per week    Comment: 3-5 glasses a week 08/10/21   Drug use: No   Sexual activity: Not on file  Other Topics Concern   Not on file  Social History Narrative   Current smoker cigar (occasional)   Alcohol -yes wine 3x  a week   No caffeine    No recreational drug use   Diet yes- diet no salt ,no caffeine   Occupation Corporate investment banker status   Children 2   Social Determinants of Radio broadcast assistant Strain: Not on file  Food Insecurity: Not on file  Transportation Needs: Not on file  Physical Activity: Not on file  Stress: Not on file  Social Connections: Not on file  Intimate Partner Violence: Not on file     ROS- All systems are reviewed and negative except as per the HPI above.  Physical Exam: Vitals:   02/08/22 1504  BP: (!) 140/72  Pulse: 64  Weight: 91.7 kg     GEN- The patient is a well appearing elderly male, alert and oriented x 3 today.   HEENT-head normocephalic, atraumatic, sclera clear, conjunctiva pink, hearing intact, trachea midline. Lungs- Clear to ausculation bilaterally, normal work of breathing Heart- Regular rate and rhythm, no murmurs, rubs or gallops  GI- soft, NT, ND, + BS Extremities- no clubbing, cyanosis, or edema MS- no significant deformity or atrophy Skin- no rash or lesion Psych- euthymic mood, full affect Neuro- strength and sensation are intact   Wt Readings from Last 3 Encounters:  02/08/22 91.7 kg  08/12/21 94.8 kg  08/10/21 95.1 kg    EKG today demonstrates  Vent. rate 64 BPM PR interval 136 ms QRS duration 136 ms QT/QTcB 434/447  ms P-R-T axes 72 93 55 Normal sinus rhythm Right bundle branch block Abnormal ECG When compared with ECG of 10-Aug-2021 15:08, PREVIOUS ECG IS PRESENT  Echo 07/14/16 demonstrated  Left ventricle: The cavity size was normal. There was mild    concentric hypertrophy. Systolic function was normal. The    estimated ejection fraction was in the range of 55% to 60%. Wall    motion was normal; there were no regional wall motion    abnormalities. Doppler parameters are consistent with abnormal    left ventricular relaxation (grade 1 diastolic dysfunction).    Doppler parameters are consistent with indeterminate ventricular    filling pressure.  - Aortic valve: Transvalvular velocity was within the normal range.    There was no stenosis. There was no regurgitation.  - Mitral valve: Transvalvular velocity was within the normal range.    There was no evidence for stenosis. There was no regurgitation.  - Left atrium: The atrium was moderately dilated.  - Right ventricle: The cavity size was mildly dilated. Wall    thickness was normal. Systolic function was normal.  - Right atrium: The atrium was moderately dilated.  - Atrial septum: No defect or patent foramen ovale was identified    by color flow Doppler.  - Tricuspid valve: There was mild regurgitation.  - Pulmonary arteries: Systolic pressure was within the normal    range. PA peak pressure: 29 mm Hg (S).  Epic records are reviewed at length today  CHA2DS2-VASc Score = 5  The patient's score is based upon: CHF History: 0 HTN History: 1 Diabetes History: 0 Stroke History: 2 Vascular Disease History: 0 Age Score: 2 Gender Score: 0       ASSESSMENT AND PLAN: 1. Paroxysmal Atrial Fibrillation/atrial flutter The patient's CHA2DS2-VASc score is 5, indicating a 7.2% annual risk of stroke.   S/p flutter ablation 2019 Patient appears to be maintaining SR. Continue Eliquis 5 mg BID Continue diltiazem 180 mg daily  Patient has a  [employer] mobile for home monitoring but has not set up.  Encouraged to set up.   2. Secondary Hypercoagulable State (ICD10:  D68.69) The patient is at significant risk for stroke/thromboembolism based upon his CHA2DS2-VASc Score of 5.  Continue Apixaban (Eliquis).   3. HTN Stable, no changes today.   Follow up with Dr Radford Pax as scheduled. AF clinic in 6 months.     Geroge Baseman Ronie Barnhart, [facility] [address] [place], [place] [address] [phone]

## 2022-02-10 DIAGNOSIS — Z23 Encounter for immunization: Secondary | ICD-10-CM | POA: Diagnosis not present

## 2022-02-11 DIAGNOSIS — J0110 Acute frontal sinusitis, unspecified: Secondary | ICD-10-CM | POA: Diagnosis not present

## 2022-03-11 DIAGNOSIS — R053 Chronic cough: Secondary | ICD-10-CM | POA: Diagnosis not present

## 2022-03-11 DIAGNOSIS — J302 Other seasonal allergic rhinitis: Secondary | ICD-10-CM | POA: Diagnosis not present

## 2022-04-06 DIAGNOSIS — M109 Gout, unspecified: Secondary | ICD-10-CM | POA: Diagnosis not present

## 2022-04-06 DIAGNOSIS — N400 Enlarged prostate without lower urinary tract symptoms: Secondary | ICD-10-CM | POA: Diagnosis not present

## 2022-04-06 DIAGNOSIS — Z0000 Encounter for general adult medical examination without abnormal findings: Secondary | ICD-10-CM | POA: Diagnosis not present

## 2022-04-06 DIAGNOSIS — K219 Gastro-esophageal reflux disease without esophagitis: Secondary | ICD-10-CM | POA: Diagnosis not present

## 2022-04-06 DIAGNOSIS — D6869 Other thrombophilia: Secondary | ICD-10-CM | POA: Diagnosis not present

## 2022-04-06 DIAGNOSIS — I4510 Unspecified right bundle-branch block: Secondary | ICD-10-CM | POA: Diagnosis not present

## 2022-04-06 DIAGNOSIS — Z79899 Other long term (current) drug therapy: Secondary | ICD-10-CM | POA: Diagnosis not present

## 2022-04-06 DIAGNOSIS — R7303 Prediabetes: Secondary | ICD-10-CM | POA: Diagnosis not present

## 2022-04-06 DIAGNOSIS — I700 Atherosclerosis of aorta: Secondary | ICD-10-CM | POA: Diagnosis not present

## 2022-04-06 DIAGNOSIS — E7800 Pure hypercholesterolemia, unspecified: Secondary | ICD-10-CM | POA: Diagnosis not present

## 2022-04-09 DIAGNOSIS — R7303 Prediabetes: Secondary | ICD-10-CM | POA: Diagnosis not present

## 2022-04-09 DIAGNOSIS — M109 Gout, unspecified: Secondary | ICD-10-CM | POA: Diagnosis not present

## 2022-04-09 DIAGNOSIS — Z79899 Other long term (current) drug therapy: Secondary | ICD-10-CM | POA: Diagnosis not present

## 2022-04-09 DIAGNOSIS — E7800 Pure hypercholesterolemia, unspecified: Secondary | ICD-10-CM | POA: Diagnosis not present

## 2022-06-01 ENCOUNTER — Other Ambulatory Visit: Payer: Self-pay | Admitting: Cardiology

## 2022-06-10 ENCOUNTER — Encounter (HOSPITAL_COMMUNITY): Payer: Self-pay | Admitting: *Deleted

## 2022-07-20 ENCOUNTER — Other Ambulatory Visit (HOSPITAL_COMMUNITY): Payer: Self-pay | Admitting: *Deleted

## 2022-07-20 MED ORDER — APIXABAN 5 MG PO TABS: 5.0000 mg | ORAL_TABLET | Freq: Two times a day (BID) | ORAL | 2 refills | Status: DC

## 2022-08-11 DIAGNOSIS — M79644 Pain in right finger(s): Secondary | ICD-10-CM | POA: Diagnosis not present

## 2022-08-11 DIAGNOSIS — L089 Local infection of the skin and subcutaneous tissue, unspecified: Secondary | ICD-10-CM | POA: Diagnosis not present

## 2022-08-25 ENCOUNTER — Encounter (HOSPITAL_COMMUNITY): Payer: Self-pay | Admitting: Physician Assistant

## 2022-08-25 ENCOUNTER — Ambulatory Visit (HOSPITAL_COMMUNITY)
Admission: RE | Admit: 2022-08-25 | Discharge: 2022-08-25 | Disposition: A | Discharge status: Home / Self Care | Payer: BLUE CROSS/BLUE SHIELD | Source: Ambulatory Visit | Attending: Physician Assistant | Admitting: Physician Assistant

## 2022-08-25 VITALS — BP 146/72 | HR 72 | Ht 75.0 in | Wt 210.0 lb

## 2022-08-25 DIAGNOSIS — Z79899 Other long term (current) drug therapy: Secondary | ICD-10-CM | POA: Insufficient documentation

## 2022-08-25 DIAGNOSIS — I4892 Unspecified atrial flutter: Secondary | ICD-10-CM | POA: Insufficient documentation

## 2022-08-25 DIAGNOSIS — Z8673 Personal history of transient ischemic attack (TIA), and cerebral infarction without residual deficits: Secondary | ICD-10-CM | POA: Insufficient documentation

## 2022-08-25 DIAGNOSIS — I480 Paroxysmal atrial fibrillation: Secondary | ICD-10-CM

## 2022-08-25 DIAGNOSIS — Z7901 Long term (current) use of anticoagulants: Secondary | ICD-10-CM | POA: Insufficient documentation

## 2022-08-25 DIAGNOSIS — I4510 Unspecified right bundle-branch block: Secondary | ICD-10-CM | POA: Diagnosis not present

## 2022-08-25 DIAGNOSIS — I10 Essential (primary) hypertension: Secondary | ICD-10-CM | POA: Insufficient documentation

## 2022-08-25 DIAGNOSIS — Z87891 Personal history of nicotine dependence: Secondary | ICD-10-CM | POA: Insufficient documentation

## 2022-08-25 DIAGNOSIS — D6869 Other thrombophilia: Secondary | ICD-10-CM

## 2022-08-25 NOTE — Progress Notes (Signed)
Primary Care Physician: Mila Palmer, MD Primary Cardiologist: Dr Mayford Knife  Primary Electrophysiologist: Dr Ladona Ridgel Referring Physician: Dr Hermelinda Medicus is a 82 y.o. male with a history of HTN, TIA, atrial flutter, atrial fibrillation who presents for follow up in the [facility].  The patient was initially diagnosed with atrial flutter and underwent ablation 04/17/18 with Dr Ladona Ridgel. He had done well until 07/02/21 when he was found to be in atrial fibrillation during cataract surgery. He was asymptomatic and rate controlled. Patient has a CHADS2VASC score of 5. He was back in SR at follow up on 07/09/21. He does consume alcohol daily. He denies significant snoring.   On follow up today, patient reports that he has done well since his last visit. He is in SR today. No bleeding issues on anticoagulation.   Today, he denies symptoms of palpitations, chest pain, shortness of breath, orthopnea, PND, lower extremity edema, dizziness, presyncope, syncope, snoring, daytime somnolence, bleeding, or neurologic sequela. The patient is tolerating medications without difficulties and is otherwise without complaint today.    Atrial Fibrillation Risk Factors:  he does not have symptoms or diagnosis of sleep apnea. he does not have a history of rheumatic fever. he does have a history of alcohol use. The patient does not have a history of early familial atrial fibrillation or other arrhythmias.  he has a BMI of Body mass index is 26.25 kg/m.Marland Kitchen Filed Weights   08/25/22 0827  Weight: 95.3 kg   Family History  Problem Relation Age of Onset   Multiple myeloma Mother    COPD Father    Colon cancer Father    Suicidality Brother    CVA Brother      Atrial Fibrillation Management history:  Previous antiarrhythmic drugs: none Previous cardioversions: none Previous ablations: 2019 flutter CHADS2VASC score: 5 Anticoagulation history: Xarelto (nosebleeds),  Eliquis   Past Medical History:  Diagnosis Date   Arthritis    hands / thumbs   Atrial flutter    in setting of URI now on Xarelto for a CHADS2VASC score of 4   BPH (benign prostatic hyperplasia)    Colon polyp    Fatigue due to excessive exertion 03/18/2014   GERD (gastroesophageal reflux disease)    Gout    Hypertension    PAF (paroxysmal atrial fibrillation)    Paroxysmal supraventricular tachycardia    Dr Mayford Knife   Pure hypercholesterolemia    Right bundle branch block    normal coronary arteries    Stroke 2009   TIA   Syncope and collapse    ? 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   Past Surgical History:  Procedure Laterality Date   A-FLUTTER ABLATION N/A 04/17/2018   Procedure: A-FLUTTER ABLATION;  Surgeon: Marinus Maw, MD;  [facility] INV[facility] CV LAB;  Service: Cardiovascular;  Laterality: N/A;   CARDIAC CATHETERIZATION     normal coronary arteries   HOLEP-LASER ENUCLEATION OF THE PROSTATE WITH MORCELLATION N/A 02/07/2018   Procedure: HOLEP-LASER ENUCLEATION OF THE PROSTATE WITH MORCELLATION;  Surgeon: Sondra Come, MD;  Location: [facility] ORS;  Service: Urology;  Laterality: N/A;   LEFT HEART CATHETERIZATION WITH CORONARY ANGIOGRAM N/A 03/20/2014   Procedure: LEFT HEART CATHETERIZATION WITH CORONARY ANGIOGRAM;  Surgeon: Kathleene Hazel, MD;  Location: [facility] CATH LAB;  Service: Cardiovascular;  Laterality: N/A;   TONSILLECTOMY      Current Outpatient Medications  Medication Sig  Dispense Refill   acetaminophen (TYLENOL) 500 MG tablet 1 tablet as needed for mild pain     allopurinol (ZYLOPRIM) 300 MG tablet Take 300 mg by mouth at bedtime.   5   apixaban (ELIQUIS) 5 MG TABS tablet Take 1 tablet (5 mg total) by mouth 2 (two) times daily. 180 tablet 2   atorvastatin (LIPITOR) 10 MG tablet Take 1 tablet (10 mg total) by mouth daily. 90 tablet 3   cetirizine (ZYRTEC) 10 MG tablet Take 10 mg by mouth as needed.     diltiazem (CARDIZEM  CD) 180 MG 24 hr capsule TAKE 1 CAPSULE BY MOUTH EVERY DAY 90 capsule 0   docusate sodium (COLACE) 100 MG capsule Take 100 mg by mouth as needed.     fluticasone (FLONASE) 50 MCG/ACT nasal spray Place 2 sprays into both nostrils as needed.     ibuprofen (ADVIL,MOTRIN) 200 MG tablet Take 200 mg by mouth as needed for moderate pain (for pain).     loratadine (CLARITIN) 10 MG tablet Take 10 mg by mouth daily as needed (for allergies or congestion).      Multiple Vitamins-Minerals (PRESERVISION AREDS PO) AREDS # 2-Taking 2 soft gels by mouth daily     omeprazole (PRILOSEC) 20 MG capsule Take 1 capsule (20 mg total) by mouth daily. 90 capsule 3   Probiotic Product (PROBIOTIC DAILY PO) Take 1 tablet by mouth daily.     psyllium (METAMUCIL) 58.6 % packet Take 1 packet by mouth daily.     No current facility-administered medications for this encounter.    Allergies  Allergen Reactions   Amoxicillin Hives and Rash    Has patient had a PCN reaction causing immediate rash, facial/tongue/throat swelling, SOB or lightheadedness with hypotension: Yes Has patient had a PCN reaction causing severe rash involving mucus membranes or skin necrosis: Yes Has patient had a PCN reaction that required hospitalization: No Has patient had a PCN reaction occurring within the last 10 years: No If all of the above answers are "NO", then may proceed with Cephalosporin use.    Shrimp [Shellfish Allergy] Hives   Zantac [Ranitidine Hcl] Hives    Social History   Socioeconomic History   Marital status: Married    Spouse name: margaret   Number of children: Not on file   Years of education: Not on file   Highest education level: Not on file  Occupational History    Comment: retired  Tobacco Use   Smoking status: Former    Types: Cigars, Cigarettes    Quit date: 1955    Years since quitting: 69.3   Smokeless tobacco: Never   Tobacco comments:    Former smoker 08/10/21  Vaping Use   Vaping Use: Never used   Substance and Sexual Activity   Alcohol use: Yes    Alcohol/week: 7.0 standard drinks of alcohol    Types: 7 Glasses of wine per week    Comment: 1 glass of wine nightly 08/10/21   Drug use: No   Sexual activity: Not on file  Other Topics Concern   Not on file  Social History Narrative   Current smoker cigar (occasional)   Alcohol -yes wine 3x a week   No caffeine    No recreational drug use   Diet yes- diet no salt ,no caffeine   Occupation Sales promotion account executive status   Children 2   Social Determinants of Corporate investment banker Strain: Not on file  Food Insecurity: Not on  file  Transportation Needs: Not on file  Physical Activity: Not on file  Stress: Not on file  Social Connections: Not on file  Intimate Partner Violence: Not on file     ROS- All systems are reviewed and negative except as per the HPI above.  Physical Exam: Vitals:   08/25/22 0827  BP: (!) 146/72  Pulse: 72  Weight: 95.3 kg  Height:  (1.905 m)     GEN- The patient is a well appearing male, alert and oriented x 3 today.   HEENT-head normocephalic, atraumatic, sclera clear, conjunctiva pink, hearing intact, trachea midline. Lungs- Clear to ausculation bilaterally, normal work of breathing Heart- Regular rate and rhythm, no murmurs, rubs or gallops  GI- soft, NT, ND, + BS Extremities- no clubbing, cyanosis, or edema MS- no significant deformity or atrophy Skin- no rash or lesion Psych- euthymic mood, full affect Neuro- strength and sensation are intact   Wt Readings from Last 3 Encounters:  08/25/22 95.3 kg  02/08/22 91.7 kg  08/12/21 94.8 kg    EKG today demonstrates  SR, RBBB Vent. rate 72 BPM PR interval 138 ms QRS duration 150 ms QT/QTcB 440/481 ms  Echo 07/27/21 demonstrated  1. Left ventricular ejection fraction, by estimation, is 60 to 65%. The  left ventricle has normal function. Left ventricular endocardial border  not optimally defined to evaluate  regional wall motion. Left ventricular  diastolic parameters are indeterminate.   2. Right ventricular systolic function is normal. The right ventricular  size is normal. There is normal pulmonary artery systolic pressure. The  estimated right ventricular systolic pressure is 26.2 mmHg.   3. The mitral valve is normal in structure. Trivial mitral valve  regurgitation. No evidence of mitral stenosis.   4. The aortic valve was not well visualized. Aortic valve regurgitation  is not visualized. No aortic stenosis is present. Aortic valve area, by  VTI measures 3.90 cm. Aortic valve mean gradient measures 1.0 mmHg.  Aortic valve Vmax measures 0.82 m/s.   5. The inferior vena cava is normal in size with greater than 50%  respiratory variability, suggesting right atrial pressure of 3 mmHg.   Epic records are reviewed at length today  CHA2DS2-VASc Score = 5  The patient's score is based upon: CHF History: 0 HTN History: 1 Diabetes History: 0 Stroke History: 2 Vascular Disease History: 0 Age Score: 2 Gender Score: 0        ASSESSMENT AND PLAN: 1. Paroxysmal Atrial Fibrillation/atrial flutter The patient's CHA2DS2-VASc score is 5, indicating a 7.2% annual risk of stroke.   S/p flutter ablation 2019 Patient appears to be maintaining SR.  Continue Eliquis 5 mg BID Continue diltiazem 180 mg daily  [employer] mobile for home monitoring.   2. Secondary Hypercoagulable State (ICD10:  D68.69) The patient is at significant risk for stroke/thromboembolism based upon his CHA2DS2-VASc Score of 5.  Continue Apixaban (Eliquis).   3. HTN Stable, no changes today.   Follow up in the AF clinic in one year.    Jorja Loa PA-C Afib Clinic [facility] [address] [place], [place] [address] [phone]

## 2022-10-19 DIAGNOSIS — I4892 Unspecified atrial flutter: Secondary | ICD-10-CM | POA: Diagnosis not present

## 2022-10-19 DIAGNOSIS — D6869 Other thrombophilia: Secondary | ICD-10-CM | POA: Diagnosis not present

## 2022-10-19 DIAGNOSIS — R053 Chronic cough: Secondary | ICD-10-CM | POA: Diagnosis not present

## 2022-10-19 DIAGNOSIS — I700 Atherosclerosis of aorta: Secondary | ICD-10-CM | POA: Diagnosis not present

## 2022-11-02 ENCOUNTER — Ambulatory Visit
Admission: RE | Admit: 2022-11-02 | Discharge: 2022-11-02 | Disposition: A | Discharge status: Home / Self Care | Payer: BLUE CROSS/BLUE SHIELD | Source: Ambulatory Visit | Attending: Allergy | Admitting: Allergy

## 2022-11-02 ENCOUNTER — Other Ambulatory Visit: Payer: Self-pay | Admitting: Allergy

## 2022-11-02 DIAGNOSIS — K219 Gastro-esophageal reflux disease without esophagitis: Secondary | ICD-10-CM | POA: Diagnosis not present

## 2022-11-02 DIAGNOSIS — R052 Subacute cough: Secondary | ICD-10-CM

## 2022-11-02 DIAGNOSIS — J3089 Other allergic rhinitis: Secondary | ICD-10-CM | POA: Diagnosis not present

## 2022-11-02 DIAGNOSIS — R059 Cough, unspecified: Secondary | ICD-10-CM | POA: Diagnosis not present

## 2022-11-02 DIAGNOSIS — J300 Vasomotor rhinitis: Secondary | ICD-10-CM | POA: Diagnosis not present

## 2022-11-10 ENCOUNTER — Other Ambulatory Visit: Payer: Self-pay | Admitting: Cardiology

## 2022-11-25 DIAGNOSIS — G7000 Myasthenia gravis without (acute) exacerbation: Secondary | ICD-10-CM | POA: Diagnosis not present

## 2022-11-25 DIAGNOSIS — H524 Presbyopia: Secondary | ICD-10-CM | POA: Diagnosis not present

## 2022-11-25 DIAGNOSIS — H26493 Other secondary cataract, bilateral: Secondary | ICD-10-CM | POA: Diagnosis not present

## 2022-11-25 DIAGNOSIS — H353132 Nonexudative age-related macular degeneration, bilateral, intermediate dry stage: Secondary | ICD-10-CM | POA: Diagnosis not present

## 2022-12-01 DIAGNOSIS — M545 Low back pain, unspecified: Secondary | ICD-10-CM | POA: Diagnosis not present

## 2022-12-06 DIAGNOSIS — H532 Diplopia: Secondary | ICD-10-CM | POA: Diagnosis not present

## 2022-12-06 DIAGNOSIS — G7000 Myasthenia gravis without (acute) exacerbation: Secondary | ICD-10-CM | POA: Diagnosis not present

## 2022-12-14 DIAGNOSIS — M545 Low back pain, unspecified: Secondary | ICD-10-CM | POA: Diagnosis not present

## 2022-12-28 DIAGNOSIS — M545 Low back pain, unspecified: Secondary | ICD-10-CM | POA: Diagnosis not present

## 2023-01-18 DIAGNOSIS — M545 Low back pain, unspecified: Secondary | ICD-10-CM | POA: Diagnosis not present

## 2023-01-24 DIAGNOSIS — N390 Urinary tract infection, site not specified: Secondary | ICD-10-CM | POA: Diagnosis not present

## 2023-01-24 DIAGNOSIS — R319 Hematuria, unspecified: Secondary | ICD-10-CM | POA: Diagnosis not present

## 2023-01-31 DIAGNOSIS — R319 Hematuria, unspecified: Secondary | ICD-10-CM | POA: Diagnosis not present

## 2023-02-01 ENCOUNTER — Encounter (HOSPITAL_COMMUNITY): Payer: Self-pay

## 2023-02-01 ENCOUNTER — Encounter (HOSPITAL_BASED_OUTPATIENT_CLINIC_OR_DEPARTMENT_OTHER): Payer: Self-pay | Admitting: Cardiology

## 2023-02-01 ENCOUNTER — Telehealth: Payer: Self-pay | Admitting: Cardiology

## 2023-02-01 DIAGNOSIS — I4891 Unspecified atrial fibrillation: Secondary | ICD-10-CM

## 2023-02-01 DIAGNOSIS — M545 Low back pain, unspecified: Secondary | ICD-10-CM | POA: Diagnosis not present

## 2023-02-01 NOTE — Telephone Encounter (Signed)
Called to discuss patient's concerns, no answer. Left detailed message explaining that Dr. Mayford Knife recommends afib clinic referral and if his symptoms escalate to chest pain or SOB he should present to ED.

## 2023-02-01 NOTE — Telephone Encounter (Signed)
Patient c/o Palpitations:  High priority if patient c/o lightheadedness, shortness of breath, or chest pain  How long have you had palpitations/irregular HR/ Afib? Are you having the symptoms now? Since Saturday  Are you currently experiencing lightheadedness, SOB or CP? No  Do you have a history of afib (atrial fibrillation) or irregular heart rhythm? Yes  Have you checked your BP or HR? (document readings if available):   Are you experiencing any other symptoms? Patient's wife called stating that the patient's apple watch was saying he is in AFIB. Patient's wife sent a MyChart message with pictures from the apple watch. Patient's wife stated the only symptom the patient has had is fatigue. Please advise.

## 2023-02-03 ENCOUNTER — Ambulatory Visit (HOSPITAL_COMMUNITY)
Admission: RE | Admit: 2023-02-03 | Discharge: 2023-02-03 | Disposition: A | Discharge status: Home / Self Care | Payer: Medicare Other | Source: Ambulatory Visit | Attending: Physician Assistant | Admitting: Physician Assistant

## 2023-02-03 ENCOUNTER — Encounter (HOSPITAL_COMMUNITY): Payer: Self-pay | Admitting: Physician Assistant

## 2023-02-03 VITALS — BP 130/84 | HR 101 | Ht 75.0 in | Wt 201.2 lb

## 2023-02-03 DIAGNOSIS — R9431 Abnormal electrocardiogram [ECG] [EKG]: Secondary | ICD-10-CM | POA: Diagnosis not present

## 2023-02-03 DIAGNOSIS — Z7901 Long term (current) use of anticoagulants: Secondary | ICD-10-CM | POA: Diagnosis not present

## 2023-02-03 DIAGNOSIS — I4892 Unspecified atrial flutter: Secondary | ICD-10-CM | POA: Diagnosis not present

## 2023-02-03 DIAGNOSIS — I10 Essential (primary) hypertension: Secondary | ICD-10-CM | POA: Insufficient documentation

## 2023-02-03 DIAGNOSIS — I4891 Unspecified atrial fibrillation: Secondary | ICD-10-CM | POA: Diagnosis not present

## 2023-02-03 DIAGNOSIS — D6869 Other thrombophilia: Secondary | ICD-10-CM | POA: Diagnosis not present

## 2023-02-03 DIAGNOSIS — I4819 Other persistent atrial fibrillation: Secondary | ICD-10-CM | POA: Diagnosis not present

## 2023-02-03 LAB — CBC
HCT: 46.9 % (ref 39.0–52.0)
Hemoglobin: 15.9 g/dL (ref 13.0–17.0)
MCH: 33.5 pg (ref 26.0–34.0)
MCHC: 33.9 g/dL (ref 30.0–36.0)
MCV: 98.9 fL (ref 80.0–100.0)
Platelets: 176 10*3/uL (ref 150–400)
RBC: 4.74 MIL/uL (ref 4.22–5.81)
RDW: 13.7 % (ref 11.5–15.5)
WBC: 7.3 10*3/uL (ref 4.0–10.5)
nRBC: 0 % (ref 0.0–0.2)

## 2023-02-03 LAB — BASIC METABOLIC PANEL
Anion gap: 10 (ref 5–15)
BUN: 22 mg/dL (ref 8–23)
CO2: 19 mmol/L — ABNORMAL LOW (ref 22–32)
Calcium: 8.6 mg/dL — ABNORMAL LOW (ref 8.9–10.3)
Chloride: 104 mmol/L (ref 98–111)
Creatinine, Ser: 1.36 mg/dL — ABNORMAL HIGH (ref 0.61–1.24)
GFR, Estimated: 51 mL/min — ABNORMAL LOW (ref >60)
Glucose, Bld: 130 mg/dL — ABNORMAL HIGH (ref 70–99)
Potassium: 4 mmol/L (ref 3.5–5.1)
Sodium: 133 mmol/L — ABNORMAL LOW (ref 135–145)

## 2023-02-03 NOTE — Progress Notes (Addendum)
Primary Care Physician: Mila Palmer, MD Primary Cardiologist: Dr Mayford Knife  Primary Electrophysiologist: Dr Ladona Ridgel Referring Physician: Dr Hermelinda Medicus is a 82 y.o. male with a history of HTN, TIA, atrial flutter, atrial fibrillation who presents for follow up in the [facility].  The patient was initially diagnosed with atrial flutter and underwent ablation 04/17/18 with Dr Ladona Ridgel. He had done well until 07/02/21 when he was found to be in atrial fibrillation during cataract surgery. He was asymptomatic and rate controlled. Patient has a CHADS2VASC score of 5. He was back in SR at follow up on 07/09/21. He does consume alcohol daily. He denies significant snoring.   On follow up today, patient reports that he purchased an Apple Watch on Monday and is has shown persistent afib since then. He has noticed some increased fatigue with certain activities but attributed this to being more sedentary. He did miss a dose of Eliquis over a week ago.   Today, he denies symptoms of palpitations, chest pain, shortness of breath, orthopnea, PND, lower extremity edema, dizziness, presyncope, syncope, snoring, daytime somnolence, bleeding, or neurologic sequela. The patient is tolerating medications without difficulties and is otherwise without complaint today.    Atrial Fibrillation Risk Factors:  he does not have symptoms or diagnosis of sleep apnea. he does not have a history of rheumatic fever. he does have a history of alcohol use. The patient does not have a history of early familial atrial fibrillation or other arrhythmias.   Atrial Fibrillation Management history:  Previous antiarrhythmic drugs: none Previous cardioversions: none Previous ablations: 2019 flutter Anticoagulation history: Xarelto (nosebleeds), Eliquis   Past Medical History:  Diagnosis Date   Arthritis    hands / thumbs   Atrial flutter (HCC)    in setting of URI now on Xarelto for a  CHADS2VASC score of 4   BPH (benign prostatic hyperplasia)    Colon polyp    Fatigue due to excessive exertion 03/18/2014   GERD (gastroesophageal reflux disease)    Gout    Hypertension    PAF (paroxysmal atrial fibrillation) (HCC)    Paroxysmal supraventricular tachycardia (HCC)    Dr Mayford Knife   Pure hypercholesterolemia    Right bundle branch block    normal coronary arteries    Stroke [facility]) 2009   TIA   Syncope and collapse    ? 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   5   apixaban (ELIQUIS) 5 MG TABS tablet Take 1 tablet (5 mg total) by mouth 2 (two) times daily. 180 tablet 2   atorvastatin (LIPITOR) 10 MG tablet Take 1 tablet (10 mg total) by mouth daily. 90 tablet 3   cetirizine (ZYRTEC) 10 MG tablet Take 10 mg by mouth as needed.     diltiazem (CARDIZEM CD) 180 MG 24 hr capsule TAKE 1 CAPSULE BY MOUTH EVERY DAY 90 capsule 1   docusate sodium (COLACE) 100 MG capsule Take 100 mg by mouth as needed.     fluticasone (FLONASE) 50 MCG/ACT nasal spray Place 2 sprays into both nostrils as needed.     ibuprofen (ADVIL,MOTRIN) 200 MG tablet Take 200 mg by mouth as needed for  moderate pain (for pain).     ipratropium (ATROVENT) 0.06 % nasal spray Place into both nostrils.     levocetirizine (XYZAL) 5 MG tablet Take 5 mg by mouth daily.     loratadine (CLARITIN) 10 MG tablet Take 10 mg by mouth daily as needed (for allergies or congestion).      Multiple Vitamins-Minerals (PRESERVISION AREDS PO) AREDS # 2-Taking 2 soft gels by mouth daily     omeprazole (PRILOSEC) 20 MG capsule Take 1 capsule (20 mg total) by mouth daily. 90 capsule 3   Probiotic Product (PROBIOTIC DAILY PO) Take 1 tablet by mouth daily.     psyllium (METAMUCIL) 58.6 % packet Take 1  packet by mouth daily.     No current facility-administered medications for this encounter.    ROS- All systems are reviewed and negative except as per the HPI above.  Physical Exam: Vitals:   02/03/23 0859  BP: 130/84  Pulse: (!) 101  Weight: 91.3 kg  Height: 6\' 3"  (1.905 m)    GEN: Well nourished, well developed in no acute distress NECK: No JVD; No carotid bruits CARDIAC: Irregularly irregular rate and rhythm, no murmurs, rubs, gallops RESPIRATORY:  Clear to auscultation without rales, wheezing or rhonchi  ABDOMEN: Soft, non-tender, non-distended EXTREMITIES:  No edema; No deformity    Wt Readings from Last 3 Encounters:  02/03/23 91.3 kg  08/25/22 95.3 kg  02/08/22 91.7 kg    EKG today demonstrates  Afib, RBBB Vent. rate 101 BPM PR interval * ms QRS duration 136 ms QT/QTcB 374/484 ms  Echo 07/27/21 demonstrated  1. Left ventricular ejection fraction, by estimation, is 60 to 65%. The  left ventricle has normal function. Left ventricular endocardial border  not optimally defined to evaluate regional wall motion. Left ventricular  diastolic parameters are indeterminate.   2. Right ventricular systolic function is normal. The right ventricular  size is normal. There is normal pulmonary artery systolic pressure. The  estimated right ventricular systolic pressure is 26.2 mmHg.   3. The mitral valve is normal in structure. Trivial mitral valve  regurgitation. No evidence of mitral stenosis.   4. The aortic valve was not well visualized. Aortic valve regurgitation  is not visualized. No aortic stenosis is present. Aortic valve area, by  VTI measures 3.90 cm. Aortic valve mean gradient measures 1.0 mmHg.  Aortic valve Vmax measures 0.82 m/s.   5. The inferior vena cava is normal in size with greater than 50%  respiratory variability, suggesting right atrial pressure of 3 mmHg.   Epic records are reviewed at length today  CHA2DS2-VASc Score = 5  The patient's score  is based upon: CHF History: 0 HTN History: 1 Diabetes History: 0 Stroke History: 2 Vascular Disease History: 0 Age Score: 2 Gender Score: 0        ASSESSMENT AND PLAN: Persistent Atrial Fibrillation/atrial flutter The patient's CHA2DS2-VASc score is 5, indicating a 7.2% annual risk of stroke.   S/p flutter ablation 2019 Patient now in persistent atrial flutter. We discussed rhythm control options today. Will plan for DCCV after 3 weeks of uninterrupted anticoagulation. Can consider AAD if he has quick return of afib. His age is borderline for ablation.  Continue Eliquis 5 mg BID Continue diltiazem 180 mg daily  Kardia mobile/Apple Watch for home monitoring    Secondary Hypercoagulable State (ICD10:  [phone]) The patient is at significant risk for stroke/thromboembolism based upon his CHA2DS2-VASc Score of 5.  Continue Apixaban (Eliquis).   HTN Stable  on current regimen   Follow up in the AF clinic post DCCV.    Informed Consent   Shared Decision Making/Informed Consent The risks (stroke, cardiac arrhythmias rarely resulting in the need for a temporary or permanent pacemaker, skin irritation or burns and complications associated with conscious sedation including aspiration, arrhythmia, respiratory failure and death), benefits (restoration of normal sinus rhythm) and alternatives of a direct current cardioversion were explained in detail to Mr. Raper and he agrees to proceed.       Jorja Loa PA-C Afib Clinic [facility] [address] [place], [place] [address] [phone]

## 2023-02-03 NOTE — Patient Instructions (Signed)
Cardioversion scheduled for: Friday, October 18th   - Arrive at the [facility] and go to admitting at 830am   - Do not eat or drink anything after midnight the night prior to your procedure.   - Take all your morning medication (except diabetic medications) with a sip of water prior to arrival.  - You will not be able to drive home after your procedure.    - Do NOT miss any doses of your blood thinner - if you should miss a dose please notify our office immediately.   - If you feel as if you go back into normal rhythm prior to scheduled cardioversion, please notify our office immediately.   If your procedure is canceled in the cardioversion suite you will be charged a cancellation fee.

## 2023-02-03 NOTE — H&P (View-Only) (Signed)
Primary Care Physician: Mila Palmer, MD Primary Cardiologist: Dr Mayford Knife  Primary Electrophysiologist: Dr Ladona Ridgel Referring Physician: Dr Hermelinda Medicus is a 82 y.o. male with a history of HTN, TIA, atrial flutter, atrial fibrillation who presents for follow up in the [facility].  The patient was initially diagnosed with atrial flutter and underwent ablation 04/17/18 with Dr Ladona Ridgel. He had done well until 07/02/21 when he was found to be in atrial fibrillation during cataract surgery. He was asymptomatic and rate controlled. Patient has a CHADS2VASC score of 5. He was back in SR at follow up on 07/09/21. He does consume alcohol daily. He denies significant snoring.   On follow up today, patient reports that he purchased an Apple Watch on Monday and is has shown persistent afib since then. He has noticed some increased fatigue with certain activities but attributed this to being more sedentary. He did miss a dose of Eliquis over a week ago.   Today, he denies symptoms of palpitations, chest pain, shortness of breath, orthopnea, PND, lower extremity edema, dizziness, presyncope, syncope, snoring, daytime somnolence, bleeding, or neurologic sequela. The patient is tolerating medications without difficulties and is otherwise without complaint today.    Atrial Fibrillation Risk Factors:  he does not have symptoms or diagnosis of sleep apnea. he does not have a history of rheumatic fever. he does have a history of alcohol use. The patient does not have a history of early familial atrial fibrillation or other arrhythmias.   Atrial Fibrillation Management history:  Previous antiarrhythmic drugs: none Previous cardioversions: none Previous ablations: 2019 flutter Anticoagulation history: Xarelto (nosebleeds), Eliquis   Past Medical History:  Diagnosis Date   Arthritis    hands / thumbs   Atrial flutter (HCC)    in setting of URI now on Xarelto for a  CHADS2VASC score of 4   BPH (benign prostatic hyperplasia)    Colon polyp    Fatigue due to excessive exertion 03/18/2014   GERD (gastroesophageal reflux disease)    Gout    Hypertension    PAF (paroxysmal atrial fibrillation) (HCC)    Paroxysmal supraventricular tachycardia (HCC)    Dr Mayford Knife   Pure hypercholesterolemia    Right bundle branch block    normal coronary arteries    Stroke [facility]) 2009   TIA   Syncope and collapse    ? 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   5   apixaban (ELIQUIS) 5 MG TABS tablet Take 1 tablet (5 mg total) by mouth 2 (two) times daily. 180 tablet 2   atorvastatin (LIPITOR) 10 MG tablet Take 1 tablet (10 mg total) by mouth daily. 90 tablet 3   cetirizine (ZYRTEC) 10 MG tablet Take 10 mg by mouth as needed.     diltiazem (CARDIZEM CD) 180 MG 24 hr capsule TAKE 1 CAPSULE BY MOUTH EVERY DAY 90 capsule 1   docusate sodium (COLACE) 100 MG capsule Take 100 mg by mouth as needed.     fluticasone (FLONASE) 50 MCG/ACT nasal spray Place 2 sprays into both nostrils as needed.     ibuprofen (ADVIL,MOTRIN) 200 MG tablet Take 200 mg by mouth as needed for  moderate pain (for pain).     ipratropium (ATROVENT) 0.06 % nasal spray Place into both nostrils.     levocetirizine (XYZAL) 5 MG tablet Take 5 mg by mouth daily.     loratadine (CLARITIN) 10 MG tablet Take 10 mg by mouth daily as needed (for allergies or congestion).      Multiple Vitamins-Minerals (PRESERVISION AREDS PO) AREDS # 2-Taking 2 soft gels by mouth daily     omeprazole (PRILOSEC) 20 MG capsule Take 1 capsule (20 mg total) by mouth daily. 90 capsule 3   Probiotic Product (PROBIOTIC DAILY PO) Take 1 tablet by mouth daily.     psyllium (METAMUCIL) 58.6 % packet Take 1  packet by mouth daily.     No current facility-administered medications for this encounter.    ROS- All systems are reviewed and negative except as per the HPI above.  Physical Exam: Vitals:   02/03/23 0859  BP: 130/84  Pulse: (!) 101  Weight: 91.3 kg  Height: 6\' 3"  (1.905 m)    GEN: Well nourished, well developed in no acute distress NECK: No JVD; No carotid bruits CARDIAC: Irregularly irregular rate and rhythm, no murmurs, rubs, gallops RESPIRATORY:  Clear to auscultation without rales, wheezing or rhonchi  ABDOMEN: Soft, non-tender, non-distended EXTREMITIES:  No edema; No deformity    Wt Readings from Last 3 Encounters:  02/03/23 91.3 kg  08/25/22 95.3 kg  02/08/22 91.7 kg    EKG today demonstrates  Afib, RBBB Vent. rate 101 BPM PR interval * ms QRS duration 136 ms QT/QTcB 374/484 ms  Echo 07/27/21 demonstrated  1. Left ventricular ejection fraction, by estimation, is 60 to 65%. The  left ventricle has normal function. Left ventricular endocardial border  not optimally defined to evaluate regional wall motion. Left ventricular  diastolic parameters are indeterminate.   2. Right ventricular systolic function is normal. The right ventricular  size is normal. There is normal pulmonary artery systolic pressure. The  estimated right ventricular systolic pressure is 26.2 mmHg.   3. The mitral valve is normal in structure. Trivial mitral valve  regurgitation. No evidence of mitral stenosis.   4. The aortic valve was not well visualized. Aortic valve regurgitation  is not visualized. No aortic stenosis is present. Aortic valve area, by  VTI measures 3.90 cm. Aortic valve mean gradient measures 1.0 mmHg.  Aortic valve Vmax measures 0.82 m/s.   5. The inferior vena cava is normal in size with greater than 50%  respiratory variability, suggesting right atrial pressure of 3 mmHg.   Epic records are reviewed at length today  CHA2DS2-VASc Score = 5  The patient's score  is based upon: CHF History: 0 HTN History: 1 Diabetes History: 0 Stroke History: 2 Vascular Disease History: 0 Age Score: 2 Gender Score: 0        ASSESSMENT AND PLAN: Persistent Atrial Fibrillation/atrial flutter The patient's CHA2DS2-VASc score is 5, indicating a 7.2% annual risk of stroke.   S/p flutter ablation 2019 Patient now in persistent atrial flutter. We discussed rhythm control options today. Will plan for DCCV after 3 weeks of uninterrupted anticoagulation. Can consider AAD if he has quick return of afib. His age is borderline for ablation.  Continue Eliquis 5 mg BID Continue diltiazem 180 mg daily  Kardia mobile/Apple Watch for home monitoring    Secondary Hypercoagulable State (ICD10:  [phone]) The patient is at significant risk for stroke/thromboembolism based upon his CHA2DS2-VASc Score of 5.  Continue Apixaban (Eliquis).   HTN Stable  on current regimen   Follow up in the AF clinic post DCCV.    Informed Consent   Shared Decision Making/Informed Consent The risks (stroke, cardiac arrhythmias rarely resulting in the need for a temporary or permanent pacemaker, skin irritation or burns and complications associated with conscious sedation including aspiration, arrhythmia, respiratory failure and death), benefits (restoration of normal sinus rhythm) and alternatives of a direct current cardioversion were explained in detail to [name] and he agrees to proceed.       Jorja Loa PA-C Afib Clinic [facility] [address] [place], [place] [address] [phone]

## 2023-02-04 ENCOUNTER — Other Ambulatory Visit (HOSPITAL_COMMUNITY): Payer: Self-pay | Admitting: *Deleted

## 2023-02-06 ENCOUNTER — Other Ambulatory Visit: Payer: Self-pay | Admitting: Cardiology

## 2023-02-24 ENCOUNTER — Ambulatory Visit (HOSPITAL_COMMUNITY): Payer: Medicare Other | Admitting: Physician Assistant

## 2023-02-24 NOTE — Progress Notes (Signed)
  Patient called with pre-procedure instructions for tomorrow NPO after midnight Take morning medications w/sip of water: Take blood thinners and BP medication w/sip of water.  Hold [place], [place], [place], etc. For one week. Arrive to [facility] at 6:30 Friday morning. Confirmed no breaks in taking blood thinner for three weeks Needs a ride home and have a responsible adult to stay w/him or her for 24 hours No lotion day of procedure No jewelry

## 2023-02-25 ENCOUNTER — Ambulatory Visit (HOSPITAL_BASED_OUTPATIENT_CLINIC_OR_DEPARTMENT_OTHER): Payer: Medicare Other | Admitting: Anesthesiology

## 2023-02-25 ENCOUNTER — Ambulatory Visit (HOSPITAL_COMMUNITY)
Admission: RE | Admit: 2023-02-25 | Discharge: 2023-02-25 | Disposition: A | Discharge status: Home / Self Care | Payer: Medicare Other | Attending: Internal Medicine | Admitting: Internal Medicine

## 2023-02-25 ENCOUNTER — Encounter (HOSPITAL_COMMUNITY)
Admission: RE | Disposition: A | Discharge status: Home / Self Care | Payer: Self-pay | Source: Home / Self Care | Attending: Internal Medicine

## 2023-02-25 ENCOUNTER — Other Ambulatory Visit: Payer: Self-pay

## 2023-02-25 ENCOUNTER — Ambulatory Visit (HOSPITAL_COMMUNITY): Payer: Medicare Other | Admitting: Anesthesiology

## 2023-02-25 ENCOUNTER — Encounter (HOSPITAL_COMMUNITY): Payer: Self-pay | Admitting: Internal Medicine

## 2023-02-25 DIAGNOSIS — Z79899 Other long term (current) drug therapy: Secondary | ICD-10-CM | POA: Diagnosis not present

## 2023-02-25 DIAGNOSIS — I480 Paroxysmal atrial fibrillation: Secondary | ICD-10-CM | POA: Diagnosis not present

## 2023-02-25 DIAGNOSIS — Z8673 Personal history of transient ischemic attack (TIA), and cerebral infarction without residual deficits: Secondary | ICD-10-CM | POA: Insufficient documentation

## 2023-02-25 DIAGNOSIS — I4892 Unspecified atrial flutter: Secondary | ICD-10-CM | POA: Diagnosis not present

## 2023-02-25 DIAGNOSIS — I4891 Unspecified atrial fibrillation: Secondary | ICD-10-CM

## 2023-02-25 DIAGNOSIS — Z7901 Long term (current) use of anticoagulants: Secondary | ICD-10-CM | POA: Insufficient documentation

## 2023-02-25 DIAGNOSIS — I4819 Other persistent atrial fibrillation: Secondary | ICD-10-CM | POA: Diagnosis not present

## 2023-02-25 DIAGNOSIS — D6869 Other thrombophilia: Secondary | ICD-10-CM | POA: Diagnosis not present

## 2023-02-25 DIAGNOSIS — E7800 Pure hypercholesterolemia, unspecified: Secondary | ICD-10-CM | POA: Diagnosis not present

## 2023-02-25 DIAGNOSIS — I10 Essential (primary) hypertension: Secondary | ICD-10-CM | POA: Insufficient documentation

## 2023-02-25 HISTORY — PX: CARDIOVERSION: SHX1299

## 2023-02-25 SURGERY — CARDIOVERSION
Anesthesia: General

## 2023-02-25 MED ORDER — SODIUM CHLORIDE 0.9 % IV SOLN: INTRAVENOUS | Status: DC

## 2023-02-25 MED ORDER — PROPOFOL 10 MG/ML IV BOLUS
INTRAVENOUS | Status: DC | PRN
  Administered 2023-02-25: 90 mg via INTRAVENOUS

## 2023-02-25 MED ORDER — LIDOCAINE 2% (20 MG/ML) 5 ML SYRINGE
INTRAMUSCULAR | Status: DC | PRN
  Administered 2023-02-25: 80 mg via INTRAVENOUS

## 2023-02-25 SURGICAL SUPPLY — 1 items: PAD DEFIB RADIO PHYSIO CONN (PAD) ×1 IMPLANT

## 2023-02-25 NOTE — Anesthesia Preprocedure Evaluation (Signed)
Anesthesia Evaluation  Patient identified by MRN, date of birth, ID band Patient awake    Reviewed: Allergy & Precautions, NPO status , Patient's Chart, lab work & pertinent test results  History of Anesthesia Complications Negative for: history of anesthetic complications  Airway Mallampati: II  TM Distance: >3 FB Neck ROM: Full    Dental no notable dental hx.    Pulmonary neg sleep apnea, neg COPD, former smoker   breath sounds clear to auscultation- rhonchi (-) wheezing      Cardiovascular hypertension, (-) CAD, (-) Cardiac Stents and (-) CABG + dysrhythmias Atrial Fibrillation  Rhythm:Regular Rate:Normal - Systolic murmurs and - Diastolic murmurs Echo 07/14/16: - Left ventricle: The cavity size was normal. There was mild   concentric hypertrophy. Systolic function was normal. The   estimated ejection fraction was in the range of 55% to 60%. Wall   motion was normal; there were no regional wall motion   abnormalities. Doppler parameters are consistent with abnormal   left ventricular relaxation (grade 1 diastolic dysfunction).   Doppler parameters are consistent with indeterminate ventricular   filling pressure. - Aortic valve: Transvalvular velocity was within the normal range.   There was no stenosis. There was no regurgitation. - Mitral valve: Transvalvular velocity was within the normal range.   There was no evidence for stenosis. There was no regurgitation. - Left atrium: The atrium was moderately dilated. - Right ventricle: The cavity size was mildly dilated. Wall   thickness was normal. Systolic function was normal. - Right atrium: The atrium was moderately dilated. - Atrial septum: No defect or patent foramen ovale was identified   by color flow Doppler. - Tricuspid valve: There was mild regurgitation. - Pulmonary arteries: Systolic pressure was within the normal   range. PA peak pressure: 29 mm Hg (S).    Neuro/Psych TIACVA  negative psych ROS   GI/Hepatic Neg liver ROS,GERD  ,,  Endo/Other  negative endocrine ROSneg diabetes    Renal/GU negative Renal ROS     Musculoskeletal  (+) Arthritis ,    Abdominal  (+) - obese  Peds  Hematology negative hematology ROS (+)   Anesthesia Other Findings Past Medical History: No date: Arthritis     Comment:  hands / thumbs No date: Atrial flutter (HCC)     Comment:  in setting of URI now on Xarelto for a CHADS2VASC score               of 4 No date: BPH (benign prostatic hyperplasia) No date: Colon polyp No date: GERD (gastroesophageal reflux disease) No date: Gout No date: Hypertension No date: Paroxysmal supraventricular tachycardia (HCC)     Comment:  Dr Mayford Knife No date: Pure hypercholesterolemia No date: Right bundle branch block     Comment:  normal coronary arteries  2009: Stroke [facility])     Comment:  TIA No date: TIA (transient ischemic attack)     Comment:  TIA, 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   Reproductive/Obstetrics                             Anesthesia Physical Anesthesia Plan  ASA: III  Anesthesia Plan: General   Post-op Pain Management: Minimal or no pain anticipated   Induction: Intravenous  PONV Risk Score and Plan: 2 and Propofol infusion and Treatment may vary due to age or medical condition  Airway Management Planned: Nasal Cannula  Additional Equipment:   Intra-op Plan:   Post-operative  Plan:   Informed Consent: I have reviewed the patients History and Physical, chart, labs and discussed the procedure including the risks, benefits and alternatives for the proposed anesthesia with the patient or authorized representative who has indicated his/her understanding and acceptance.     Dental advisory given  Plan Discussed with: CRNA and Anesthesiologist  Anesthesia Plan Comments:         Anesthesia Quick Evaluation

## 2023-02-25 NOTE — Transfer of Care (Signed)
Immediate Anesthesia Transfer of Care Note  Patient: Logan Aguirre  Procedure(s) Performed: CARDIOVERSION  Patient Location: PACU  Anesthesia Type:General  Level of Consciousness: drowsy  Airway & Oxygen Therapy: Patient Spontanous Breathing and Patient connected to face mask oxygen  Post-op Assessment: Report given to RN and Post -op Vital signs reviewed and stable  Post vital signs: Reviewed and stable  Last Vitals:  Vitals Value Taken Time  BP    Temp    Pulse 72 02/25/23 0749  Resp 19 02/25/23 0749  SpO2 94 % 02/25/23 0749  Vitals shown include unfiled device data.  Last Pain:  Vitals:   02/25/23 0700  TempSrc:   PainSc: 0-No pain         Complications: No notable events documented.

## 2023-02-25 NOTE — Anesthesia Postprocedure Evaluation (Signed)
Anesthesia Post Note  Patient: Logan Aguirre  Procedure(s) Performed: CARDIOVERSION     Patient location during evaluation: PACU Anesthesia Type: General Level of consciousness: awake and alert Pain management: pain level controlled Vital Signs Assessment: post-procedure vital signs reviewed and stable Respiratory status: spontaneous breathing, nonlabored ventilation and respiratory function stable Cardiovascular status: blood pressure returned to baseline and stable Postop Assessment: no apparent nausea or vomiting Anesthetic complications: no   No notable events documented.  Last Vitals:  Vitals:   02/25/23 0800 02/25/23 0810  BP: 135/79 135/82  Pulse: 70 75  Resp: 19 12  Temp:    SpO2: 96% 98%    Last Pain:  Vitals:   02/25/23 0810  TempSrc:   PainSc: 0-No pain                 Lowella Curb

## 2023-02-25 NOTE — CV Procedure (Signed)
    Electrical Cardioversion Procedure Note Logan Aguirre [id number] [birth year]  Procedure: Electrical Cardioversion Indications:  Atrial Fibrillation  Time Out: Verified patient identification, verified procedure,medications/allergies/relevent history reviewed, required imaging and test results available.  Performed  Procedure Details  The patient was NPO after midnight. Anesthesia was administered at the beside  by Dr. Freida Busman.  Cardioversion was done with synchronized biphasic defibrillation with AP pads with 200 Joules.  The patient converted to sinus rhythm The patient tolerated the procedure well.  IMPRESSION:  Successful cardioversion of atrial fibrillation   Logan Lam, MD FASE [facility] Cardiologist [facility]  [address] [place], [address] [place], [place] [address] [phone]  7:46 AM

## 2023-02-25 NOTE — Discharge Instructions (Signed)

## 2023-02-25 NOTE — Interval H&P Note (Signed)
History and Physical Interval Note:  02/25/2023 7:35 AM  Logan Aguirre  has presented today for surgery, with the diagnosis of afib.  The various methods of treatment have been discussed with the patient and family. After consideration of risks, benefits and other options for treatment, the patient has consented to  Procedure(s): CARDIOVERSION (N/A) as a surgical intervention.  The patient's history has been reviewed, patient examined, no change in status, stable for surgery.  I have reviewed the patient's chart and labs.  Questions were answered to the patient's satisfaction.     Marnee Sherrard A Breana Litts

## 2023-02-25 NOTE — Interval H&P Note (Signed)
History and Physical Interval Note:  02/25/2023 7:21 AM  Logan Aguirre  has presented today for surgery, with the diagnosis of afib.  The various methods of treatment have been discussed with the patient and family. After consideration of risks, benefits and other options for treatment, the patient has consented to  Procedure(s): CARDIOVERSION (N/A) as a surgical intervention.  The patient's history has been reviewed, patient examined, no change in status, stable for surgery.  I have reviewed the patient's chart and labs.  Questions were answered to the patient's satisfaction.     Waleed Dettman A Cedricka Sackrider

## 2023-02-28 ENCOUNTER — Encounter (HOSPITAL_COMMUNITY): Payer: Self-pay | Admitting: Internal Medicine

## 2023-03-10 ENCOUNTER — Ambulatory Visit (HOSPITAL_COMMUNITY)
Admission: RE | Admit: 2023-03-10 | Discharge: 2023-03-10 | Disposition: A | Discharge status: Home / Self Care | Payer: Medicare Other | Source: Ambulatory Visit | Attending: Physician Assistant | Admitting: Physician Assistant

## 2023-03-10 ENCOUNTER — Ambulatory Visit (HOSPITAL_COMMUNITY): Payer: Medicare Other | Admitting: Physician Assistant

## 2023-03-10 VITALS — BP 118/66 | HR 78 | Ht 75.0 in | Wt 200.6 lb

## 2023-03-10 DIAGNOSIS — Z79899 Other long term (current) drug therapy: Secondary | ICD-10-CM | POA: Insufficient documentation

## 2023-03-10 DIAGNOSIS — Z7901 Long term (current) use of anticoagulants: Secondary | ICD-10-CM | POA: Diagnosis not present

## 2023-03-10 DIAGNOSIS — Z8673 Personal history of transient ischemic attack (TIA), and cerebral infarction without residual deficits: Secondary | ICD-10-CM | POA: Insufficient documentation

## 2023-03-10 DIAGNOSIS — I4892 Unspecified atrial flutter: Secondary | ICD-10-CM | POA: Diagnosis not present

## 2023-03-10 DIAGNOSIS — D6869 Other thrombophilia: Secondary | ICD-10-CM | POA: Diagnosis not present

## 2023-03-10 DIAGNOSIS — I4819 Other persistent atrial fibrillation: Secondary | ICD-10-CM

## 2023-03-10 DIAGNOSIS — I10 Essential (primary) hypertension: Secondary | ICD-10-CM | POA: Diagnosis not present

## 2023-03-10 LAB — BASIC METABOLIC PANEL
Anion gap: 7 (ref 5–15)
BUN: 20 mg/dL (ref 8–23)
CO2: 22 mmol/L (ref 22–32)
Calcium: 8.3 mg/dL — ABNORMAL LOW (ref 8.9–10.3)
Chloride: 113 mmol/L — ABNORMAL HIGH (ref 98–111)
Creatinine, Ser: 1.13 mg/dL (ref 0.61–1.24)
GFR, Estimated: >60 mL/min (ref >60)
Glucose, Bld: 114 mg/dL — ABNORMAL HIGH (ref 70–99)
Potassium: 3.9 mmol/L (ref 3.5–5.1)
Sodium: 142 mmol/L (ref 135–145)

## 2023-03-10 NOTE — Progress Notes (Signed)
Primary Care Physician: Mila Palmer, MD Primary Cardiologist: Dr Mayford Knife  Primary Electrophysiologist: Dr Ladona Ridgel Referring Physician: Dr Hermelinda Medicus is a 82 y.o. male with a history of HTN, TIA, atrial flutter, atrial fibrillation who presents for follow up in the [facility].  The patient was initially diagnosed with atrial flutter and underwent ablation 04/17/18 with Dr Ladona Ridgel. He had done well until 07/02/21 when he was found to be in atrial fibrillation during cataract surgery. He was asymptomatic and rate controlled. Patient has a CHADS2VASC score of 5. He was back in SR at follow up on 07/09/21. He does consume alcohol daily. He denies significant snoring.   On follow up today, patient is s/p DCCV on 02/25/23. He noticed that he was back in afib two days later. He did not feel any different in SR and remains asymptomatic. No bleeding issues on anticoagulation.   Today, he denies symptoms of palpitations, chest pain, shortness of breath, orthopnea, PND, lower extremity edema, dizziness, presyncope, syncope, snoring, daytime somnolence, bleeding, or neurologic sequela. The patient is tolerating medications without difficulties and is otherwise without complaint today.    Atrial Fibrillation Risk Factors:  he does not have symptoms or diagnosis of sleep apnea. he does not have a history of rheumatic fever. he does have a history of alcohol use. The patient does not have a history of early familial atrial fibrillation or other arrhythmias.   Atrial Fibrillation Management history:  Previous antiarrhythmic drugs: none Previous cardioversions: 02/25/23 Previous ablations: 2019 flutter Anticoagulation history: Xarelto (nosebleeds), Eliquis   Past Medical History:  Diagnosis Date   Arthritis    hands / thumbs   Atrial flutter (HCC)    in setting of URI now on Xarelto for a CHADS2VASC score of 4   BPH (benign prostatic hyperplasia)     Colon polyp    Fatigue due to excessive exertion 03/18/2014   GERD (gastroesophageal reflux disease)    Gout    Hypertension    PAF (paroxysmal atrial fibrillation) (HCC)    Paroxysmal supraventricular tachycardia (HCC)    Dr Mayford Knife   Pure hypercholesterolemia    Right bundle branch block    normal coronary arteries    Stroke [facility]) 2009   TIA   Syncope and collapse    ? 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   5   apixaban (ELIQUIS) 5 MG TABS tablet Take 1 tablet (5 mg total) by mouth 2 (two) times daily. 180 tablet 2   atorvastatin (LIPITOR) 10 MG tablet Take 1 tablet (10 mg total) by mouth daily. (Patient taking differently: Take 10 mg by mouth every evening.) 90 tablet 3   diltiazem (CARDIZEM CD) 180 MG 24 hr capsule Take 1 capsule (180 mg total) by mouth daily. Please call [phone] to schedule an overdue appointment with Dr. Armanda Magic for future refills. Thank you. 1st attempt. (Patient taking differently: Take 180 mg by mouth every evening. Please call [phone] to schedule an overdue appointment with Dr. Armanda Magic for future refills. Thank you. 1st attempt.) 30 capsule 0   ipratropium (ATROVENT) 0.06 % nasal spray Place 1 spray into both nostrils at bedtime.     Multiple Vitamins-Minerals (PRESERVISION AREDS PO) Take 1 tablet by mouth in the morning and  at bedtime. AREDS # 2     omeprazole (PRILOSEC) 20 MG capsule Take 1 capsule (20 mg total) by mouth daily. (Patient taking differently: Take 20 mg by mouth every evening.) 90 capsule 3   No current facility-administered medications for this encounter.    ROS- All systems are reviewed and negative except as per the HPI above.  Physical Exam: Vitals:   03/10/23 1423  BP: 118/66  Pulse: 78  Weight: 91 kg  Height: 6\' 3"  (1.905 m)     GEN: Well  nourished, well developed in no acute distress NECK: No JVD; No carotid bruits CARDIAC: Irregularly irregular rate and rhythm, no murmurs, rubs, gallops RESPIRATORY:  Clear to auscultation without rales, wheezing or rhonchi  ABDOMEN: Soft, non-tender, non-distended EXTREMITIES:  No edema; No deformity    Wt Readings from Last 3 Encounters:  03/10/23 91 kg  02/25/23 90.7 kg  02/03/23 91.3 kg    EKG today demonstrates  Afib, RBBB Vent. rate 78 BPM PR interval * ms QRS duration 136 ms QT/QTcB 422/481 ms   Echo 07/27/21 demonstrated  1. Left ventricular ejection fraction, by estimation, is 60 to 65%. The  left ventricle has normal function. Left ventricular endocardial border  not optimally defined to evaluate regional wall motion. Left ventricular  diastolic parameters are indeterminate.   2. Right ventricular systolic function is normal. The right ventricular  size is normal. There is normal pulmonary artery systolic pressure. The  estimated right ventricular systolic pressure is 26.2 mmHg.   3. The mitral valve is normal in structure. Trivial mitral valve  regurgitation. No evidence of mitral stenosis.   4. The aortic valve was not well visualized. Aortic valve regurgitation  is not visualized. No aortic stenosis is present. Aortic valve area, by  VTI measures 3.90 cm. Aortic valve mean gradient measures 1.0 mmHg.  Aortic valve Vmax measures 0.82 m/s.   5. The inferior vena cava is normal in size with greater than 50%  respiratory variability, suggesting right atrial pressure of 3 mmHg.   Epic records are reviewed at length today  CHA2DS2-VASc Score = 5  The patient's score is based upon: CHF History: 0 HTN History: 1 Diabetes History: 0 Stroke History: 2 Vascular Disease History: 0 Age Score: 2 Gender Score: 0        ASSESSMENT AND PLAN: Persistent Atrial Fibrillation/atrial flutter The patient's CHA2DS2-VASc score is 5, indicating a 7.2% annual risk of stroke.    S/p flutter ablation 2019 S/p DCCV 02/25/23 with quick return of afib. We discussed rate vs rhythm control. Given his age and paucity of symptoms, will pursue rate control.  Continue Eliquis 5 mg BID Continue diltiazem 180 mg daily  Kardia mobile/Apple Watch for home monitoring    Secondary Hypercoagulable State (ICD10:  [phone]) The patient is at significant risk for stroke/thromboembolism based upon his CHA2DS2-VASc Score of 5.  Continue Apixaban (Eliquis).   HTN Stable on current regimen   Follow up in the AF clinic in 6 months.     Jorja Loa PA-C Afib Clinic [facility] [address] [place], [place] [address] [phone]

## 2023-04-20 DIAGNOSIS — Z87898 Personal history of other specified conditions: Secondary | ICD-10-CM | POA: Diagnosis not present

## 2023-04-20 DIAGNOSIS — E7800 Pure hypercholesterolemia, unspecified: Secondary | ICD-10-CM | POA: Diagnosis not present

## 2023-04-20 DIAGNOSIS — Z1331 Encounter for screening for depression: Secondary | ICD-10-CM | POA: Diagnosis not present

## 2023-04-20 DIAGNOSIS — Z0000 Encounter for general adult medical examination without abnormal findings: Secondary | ICD-10-CM | POA: Diagnosis not present

## 2023-04-20 DIAGNOSIS — I4821 Permanent atrial fibrillation: Secondary | ICD-10-CM | POA: Diagnosis not present

## 2023-04-20 DIAGNOSIS — Z79899 Other long term (current) drug therapy: Secondary | ICD-10-CM | POA: Diagnosis not present

## 2023-05-28 ENCOUNTER — Other Ambulatory Visit: Payer: Self-pay | Admitting: Cardiology

## 2023-07-07 NOTE — Telephone Encounter (Signed)
 Made in error

## 2023-08-10 ENCOUNTER — Other Ambulatory Visit (HOSPITAL_COMMUNITY): Payer: Self-pay | Admitting: Physician Assistant

## 2023-08-29 ENCOUNTER — Encounter (HOSPITAL_COMMUNITY): Payer: Self-pay | Admitting: Physician Assistant

## 2023-08-29 ENCOUNTER — Ambulatory Visit (HOSPITAL_COMMUNITY)
Admission: RE | Admit: 2023-08-29 | Discharge: 2023-08-29 | Disposition: A | Discharge status: Home / Self Care | Payer: BLUE CROSS/BLUE SHIELD | Source: Ambulatory Visit | Attending: Physician Assistant | Admitting: Physician Assistant

## 2023-08-29 VITALS — BP 116/60 | HR 61 | Ht 75.0 in | Wt 194.2 lb

## 2023-08-29 DIAGNOSIS — I4821 Permanent atrial fibrillation: Secondary | ICD-10-CM | POA: Diagnosis not present

## 2023-08-29 DIAGNOSIS — I471 Supraventricular tachycardia, unspecified: Secondary | ICD-10-CM | POA: Diagnosis not present

## 2023-08-29 DIAGNOSIS — D6869 Other thrombophilia: Secondary | ICD-10-CM

## 2023-08-29 MED ORDER — DILTIAZEM HCL ER COATED BEADS 180 MG PO CP24: 180.0000 mg | ORAL_CAPSULE | Freq: Every day | ORAL | 3 refills | Status: DC

## 2023-08-29 NOTE — Progress Notes (Signed)
 Primary Care Physician: Olin Bertin, MD Primary Cardiologist: Dr Micael Adas  Primary Electrophysiologist: Dr Carolynne Citron Referring Physician: Dr Talmadge Fail is a 83 y.o. male with a history of HTN, TIA, atrial flutter, atrial fibrillation who presents for follow up in the [facility].  The patient was initially diagnosed with atrial flutter and underwent ablation 04/17/18 with Dr Carolynne Citron. He had done well until 07/02/21 when he was found to be in atrial fibrillation during cataract surgery. He was asymptomatic and rate controlled. Patient has a CHADS2VASC score of 5. He was back in SR at follow up on 07/09/21. He does consume alcohol daily. He denies significant snoring. Patient is s/p DCCV on 02/25/23. He noticed that he was back in afib two days later. Decision was made at that time to pursue rate control only.  Patient returns for follow up for atrial fibrillation. Patient reports that he has done well since his last visit. He remains rate controlled and unaware of his arrhythmia. No bleeding issues on anticoagulation.   Today, he  denies symptoms of palpitations, chest pain, shortness of breath, orthopnea, PND, lower extremity edema, dizziness, presyncope, syncope, snoring, daytime somnolence, bleeding, or neurologic sequela. The patient is tolerating medications without difficulties and is otherwise without complaint today.    Atrial Fibrillation Risk Factors:  he does not have symptoms or diagnosis of sleep apnea. he does not have a history of rheumatic fever. he does have a history of alcohol use. The patient does not have a history of early familial atrial fibrillation or other arrhythmias.   Atrial Fibrillation Management history:  Previous antiarrhythmic drugs: none Previous cardioversions: 02/25/23 Previous ablations: 2019 flutter Anticoagulation history: Xarelto  (nosebleeds), Eliquis    Past Medical History:  Diagnosis Date   Arthritis     hands / thumbs   Atrial flutter (HCC)    in setting of URI now on Xarelto  for a CHADS2VASC score of 4   BPH (benign prostatic hyperplasia)    Colon polyp    Fatigue due to excessive exertion 03/18/2014   GERD (gastroesophageal reflux disease)    Gout    Hypertension    PAF (paroxysmal atrial fibrillation) (HCC)    Paroxysmal supraventricular tachycardia (HCC)    Dr Micael Adas   Pure hypercholesterolemia    Right bundle branch block    normal coronary arteries    Stroke [facility]) 2009   TIA   Syncope and collapse    ? 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   5   atorvastatin  (LIPITOR) 10 MG tablet Take 1 tablet (10 mg total) by mouth daily. 90 tablet 3   ELIQUIS  5 MG TABS tablet TAKE 1 TABLET BY MOUTH TWICE A DAY 180 tablet 2   ipratropium (ATROVENT) 0.06 % nasal spray Place 1 spray into both nostrils at bedtime.     Multiple Vitamins-Minerals (PRESERVISION AREDS PO) Take 1 tablet by mouth in the morning and at bedtime. AREDS # 2     omeprazole  (PRILOSEC) 20 MG capsule Take 1 capsule (20 mg total) by mouth daily. 90 capsule 3   diltiazem  (CARDIZEM  CD) 180 MG 24 hr capsule Take 1 capsule (180 mg total) by mouth daily. 90 capsule 3   No current facility-administered medications for this encounter.    ROS-  All systems are reviewed and negative except as per the HPI above.  Physical Exam: Vitals:   08/29/23 0823  BP: 116/60  Pulse: 61  Weight: 88.1 kg  Height: 6\' 3"  (1.905 m)    GEN: Well nourished, well developed in no acute distress CARDIAC: Irregularly irregular rate and rhythm, no murmurs, rubs, gallops RESPIRATORY:  Clear to auscultation without rales, wheezing or rhonchi  ABDOMEN: Soft, non-tender, non-distended EXTREMITIES:  No edema; No deformity    Wt Readings from Last 3 Encounters:  08/29/23 88.1 kg   03/10/23 91 kg  02/25/23 90.7 kg    EKG today demonstrates  Afib, RBBB Vent. rate 61 BPM PR interval * ms QRS duration 138 ms QT/QTcB 432/434 ms   Echo 07/27/21 demonstrated  1. Left ventricular ejection fraction, by estimation, is 60 to 65%. The  left ventricle has normal function. Left ventricular endocardial border  not optimally defined to evaluate regional wall motion. Left ventricular  diastolic parameters are indeterminate.   2. Right ventricular systolic function is normal. The right ventricular  size is normal. There is normal pulmonary artery systolic pressure. The  estimated right ventricular systolic pressure is 26.2 mmHg.   3. The mitral valve is normal in structure. Trivial mitral valve  regurgitation. No evidence of mitral stenosis.   4. The aortic valve was not well visualized. Aortic valve regurgitation  is not visualized. No aortic stenosis is present. Aortic valve area, by  VTI measures 3.90 cm. Aortic valve mean gradient measures 1.0 mmHg.  Aortic valve Vmax measures 0.82 m/s.   5. The inferior vena cava is normal in size with greater than 50%  respiratory variability, suggesting right atrial pressure of 3 mmHg.   Epic records are reviewed at length today  CHA2DS2-VASc Score = 5  The patient's score is based upon: CHF History: 0 HTN History: 1 Diabetes History: 0 Stroke History: 2 Vascular Disease History: 0 Age Score: 2 Gender Score: 0       ASSESSMENT AND PLAN: Permanent Atrial Fibrillation/atrial flutter The patient's CHA2DS2-VASc score is 5, indicating a 7.2% annual risk of stroke.   S/p flutter ablation 2019 Now pursuing rate control only. He is rate controlled and asymptomatic. Continue Eliquis  5 mg BID Continue diltiazem  180 mg daily Kardia mobile/Apple watch for home monitoring.   Secondary Hypercoagulable State (ICD10:  D68.69) The patient is at significant risk for stroke/thromboembolism based upon his CHA2DS2-VASc Score of 5.   Continue Apixaban  (Eliquis ). No bleeding issues.   HTN Stable on current regimen   Follow up with Dr Micael Adas as scheduled. AF clinic as needed.     Myrtha Ates PA-C Afib Clinic [facility] [address] [place], [place] [address] [phone]

## 2023-09-20 ENCOUNTER — Other Ambulatory Visit: Payer: Self-pay | Admitting: Cardiology

## 2023-09-21 ENCOUNTER — Other Ambulatory Visit: Payer: Self-pay | Admitting: Cardiology

## 2023-09-28 ENCOUNTER — Ambulatory Visit: Attending: Cardiology | Admitting: Cardiology

## 2023-09-28 ENCOUNTER — Other Ambulatory Visit: Payer: Self-pay | Admitting: *Deleted

## 2023-09-28 VITALS — BP 121/70 | HR 79 | Ht 75.0 in | Wt 197.0 lb

## 2023-09-28 DIAGNOSIS — I951 Orthostatic hypotension: Secondary | ICD-10-CM

## 2023-09-28 DIAGNOSIS — I480 Paroxysmal atrial fibrillation: Secondary | ICD-10-CM

## 2023-09-28 DIAGNOSIS — I4510 Unspecified right bundle-branch block: Secondary | ICD-10-CM | POA: Diagnosis not present

## 2023-09-28 DIAGNOSIS — I10 Essential (primary) hypertension: Secondary | ICD-10-CM | POA: Diagnosis not present

## 2023-09-28 MED ORDER — APIXABAN 5 MG PO TABS: 5.0000 mg | ORAL_TABLET | Freq: Two times a day (BID) | ORAL | 3 refills | Status: AC

## 2023-09-28 MED ORDER — DILTIAZEM HCL ER COATED BEADS 180 MG PO CP24: 180.0000 mg | ORAL_CAPSULE | Freq: Every day | ORAL | 3 refills | Status: DC

## 2023-09-28 NOTE — Addendum Note (Signed)
 Addended by: Delroy Fields on: 09/28/2023 08:49 AM   Modules accepted: Orders

## 2023-09-28 NOTE — Telephone Encounter (Signed)
 Prescription refill request for Eliquis  received. Indication: AF Last office visit: 09/28/23  Golden Late MD Scr: 1.13 on 03/10/23  Epic Age: 83 Weight: 89.4kg  Based on above findings Eliquis  5mg  twice daily is the appropriate dose.  Ok to refill x 1 year per Dr Micael Adas.

## 2023-09-28 NOTE — Progress Notes (Signed)
 Cardiology Office Note:    Date:  09/28/2023   ID:  Logan Aguirre 1940/11/20, MRN [id number]  PCP:  Logan Bertin, MD  Cardiologist:  Logan Keas, MD    Referring MD: Logan Bertin, MD   Chief Complaint  Patient presents with   Atrial Fibrillation   Hypertension    History of Present Illness:    Logan Aguirre is a 83 y.o. male with a hx of normal coronary arteries, chronic RBBB, SVT, HTN, chronic atrial fibrillation on Eliquis , CHA2DS2-VASc equals 4, prior TIA.  He was found on 07/02/2021 to be in afib at time of his cataract surgery and was seen in afib clinic and started on Eliquis  5mg  BID for CHADS2VCASC score of 5.  Followed in afib clinic.  Patient is s/p DCCV on 02/25/23. He noticed that he was back in afib two days later. Decision was made at that time to pursue rate control only.    He is here today for followup and is doing well.  He denies any chest pain or pressure, SOB, DOE, PND, orthopnea, LE edema, dizziness, palpitations or syncope. He is compliant with his meds and is tolerating meds with no SE.    Past Medical History:  Diagnosis Date   Arthritis    hands / thumbs   Atrial flutter (HCC)    in setting of URI now on Xarelto  for a CHADS2VASC score of 4   BPH (benign prostatic hyperplasia)    Logan polyp    Fatigue due to excessive exertion 03/18/2014   GERD (gastroesophageal reflux disease)    Gout    Hypertension    PAF (paroxysmal atrial fibrillation) (HCC)    Paroxysmal supraventricular tachycardia (HCC)    Dr Logan Aguirre   Pure hypercholesterolemia    Right bundle branch block    normal coronary arteries    Stroke [facility]) 2009   TIA   Syncope and collapse    ? TIA    TIA (transient ischemic attack)    TIA, old Logan Aguirre by MRI    Past Surgical History:  Procedure Laterality Date   A-FLUTTER ABLATION N/A 04/17/2018   Procedure: A-FLUTTER ABLATION;  Surgeon: Logan Fall, MD;  Location: [facility] INVASIVE CV LAB;  Service: Cardiovascular;  Laterality:  N/A;   CARDIAC CATHETERIZATION     normal coronary arteries   CARDIOVERSION N/A 02/25/2023   Procedure: CARDIOVERSION;  Surgeon: Logan Melody, MD;  Location: [facility] INVASIVE CV LAB;  Service: Cardiovascular;  Laterality: N/A;   HOLEP-LASER ENUCLEATION OF THE PROSTATE WITH MORCELLATION N/A 02/07/2018   Procedure: HOLEP-LASER ENUCLEATION OF THE PROSTATE WITH MORCELLATION;  Surgeon: Logan Pressman, MD;  Location: [facility] ORS;  Service: Urology;  Laterality: N/A;   LEFT HEART CATHETERIZATION WITH CORONARY ANGIOGRAM N/A 03/20/2014   Procedure: LEFT HEART CATHETERIZATION WITH CORONARY ANGIOGRAM;  Surgeon: Logan Benne, MD;  Location: [facility] CATH LAB;  Service: Cardiovascular;  Laterality: N/A;   TONSILLECTOMY      Current Medications: Current Meds  Medication Sig   allopurinol  (ZYLOPRIM ) 300 MG tablet Take 300 mg by mouth at bedtime.    atorvastatin  (LIPITOR) 10 MG tablet Take 1 tablet (10 mg total) by mouth daily.   cholecalciferol (VITAMIN D3) 25 MCG (1000 UNIT) tablet Take 2,000 Units by mouth daily.   diltiazem  (CARDIZEM  CD) 180 MG 24 hr capsule Take 1 capsule (180 mg total) by mouth daily. PLEASE KEEP UPCOMING APPOINTMENT IN ORDER TO RECEIVE FUTURE REFILLS. THANK YOU!   ELIQUIS  5 MG TABS tablet  TAKE 1 TABLET BY MOUTH TWICE A DAY   ipratropium (ATROVENT) 0.06 % nasal spray Place 1 spray into both nostrils at bedtime.   Logan Vitamins-Minerals (PRESERVISION AREDS PO) Take 1 tablet by mouth in the morning and at bedtime. AREDS # 2   omeprazole  (PRILOSEC) 20 MG capsule Take 1 capsule (20 mg total) by mouth daily.     Allergies:   Amoxicillin, Shrimp [shellfish allergy], and Zantac [ranitidine hcl]   Social History   Socioeconomic History   Marital status: Married    Spouse name: Logan Aguirre   Number of children: 2   Years of education: Not on file   Highest education level: Not on file  Occupational History    Comment: retired  Tobacco Use   Smoking status: Former     Current packs/day: 0.00    Types: Cigars, Cigarettes    Quit date: 1955    Years since quitting: 70.4   Smokeless tobacco: Never   Tobacco comments:    Former smoker 08/10/21  Vaping Use   Vaping status: Never Used  Substance and Sexual Activity   Alcohol use: Yes    Alcohol/week: 7.0 standard drinks of alcohol    Types: 7 Glasses of wine per week    Comment: 1 glass of wine nightly 08/10/21   Drug use: No   Sexual activity: Not on file  Other Topics Concern   Not on file  Social History Narrative   Current smoker cigar (occasional)   Alcohol -yes wine 3x a week   No caffeine    No recreational drug use   Diet yes- diet no salt ,no caffeine   Occupation Sales promotion account executive status   Children 2   Social Drivers of Corporate investment banker Strain: Not on file  Food Insecurity: Not on file  Transportation Needs: Not on file  Physical Activity: Not on file  Stress: Not on file  Social Connections: Not on file     Family History: The patient's family history includes Logan Aguirre in his father; Logan Aguirre in his brother; Logan Aguirre in his father; Logan Aguirre in his mother; Logan Aguirre in his brother.  ROS:   Please see the history of present illness.    ROS  All other systems reviewed and negative.   EKGs/Labs/Other Studies Reviewed:    The following studies were reviewed today: none  Recent Labs: 02/03/2023: Hemoglobin 15.9; Platelets 176 03/10/2023: BUN 20; Creatinine, Ser 1.13; Potassium 3.9; Sodium 142   Recent Lipid Panel    Component Value Date/Time   CHOL  11/07/2007 2355    173        ATP III CLASSIFICATION:  <200     mg/dL   Desirable  161-096  mg/dL   Borderline High  >=045    mg/dL   High   TRIG 409 81/19/1478 2355   HDL 27 (L) 11/07/2007 2355   CHOLHDL 6.4 11/07/2007 2355   VLDL 29 11/07/2007 2355   LDLCALC (H) 11/07/2007 2355    117        Total Cholesterol/HDL:CHD Risk Coronary Heart Disease Risk Table                     Men   Women   1/2 Average Risk   3.4   3.3    Physical Exam:    VS:  BP 121/70 (BP Location: Left Arm)   Pulse 79   Ht 6\' 3"  (1.905 m)   Wt 197 lb (89.4  kg)   SpO2 100%   BMI 24.62 kg/m     Wt Readings from Last 3 Encounters:  09/28/23 197 lb (89.4 kg)  08/29/23 194 lb 3.2 oz (88.1 kg)  03/10/23 200 lb 9.6 oz (91 kg)    GEN: Well nourished, well developed in no acute distress HEENT: Normal NECK: No JVD; No carotid bruits LYMPHATICS: No lymphadenopathy CARDIAC:irreguarly irregular, no murmurs, rubs, gallops RESPIRATORY:  Clear to auscultation without rales, wheezing or rhonchi  ABDOMEN: Soft, non-tender, non-distended MUSCULOSKELETAL:  No edema; No deformity  SKIN: Warm and dry NEUROLOGIC:  Alert and oriented x 3 PSYCHIATRIC:  Normal affect  ASSESSMENT:    1. Orthostatic hypotension   2. Paroxysmal atrial fibrillation (HCC)   3. RBBB   4. Benign essential HTN     PLAN:    In order of problems listed above:  1. Orthostatic hypotension -felt secondary in the past to decreased po intake -no further dizziness or syncope since I saw him last  2.  Chronic atrial Fibrillation -s/p aflutter ablation -developed PAF in March 2023 at time of cataract surgery -CHADS2VAS score is 26 (age >71, TIA, HTN) -s/p DCCV on 02/25/23. He noticed that he was back in afib two days later. Decision was made at that time to pursue rate control only.  -Continue Cardizem  CD 180 mg daily and Eliquis  5 mg twice daily with as needed refills -denies any bleeding issues on DOAC -I have personally reviewed and interpreted outside labs performed by patient's PCP which showed  serum creatinine 1.13 on 03/10/2023, potassium 4.3 and hemoglobin 14.7 on 04/20/2023  3.  RBBB  -noted as far back as November 2014 with nuclear stress test in 2015 showing no ischemia.  3.  HTN - BP controlled on exam today - Continue MCD 180 mg daily with as needed refills   Medication Adjustments/Labs and Tests Ordered: Current  medicines are reviewed at length with the patient today.  Concerns regarding medicines are outlined above.  No orders of the defined types were placed in this encounter.  No orders of the defined types were placed in this encounter.   Signed, Logan Keas, MD  09/28/2023 8:36 AM    [facility]

## 2023-09-28 NOTE — Patient Instructions (Signed)
 Medication Instructions:  Refilled Cardizem  and Eliquis  (per pharmacy) *If you need a refill on your cardiac medications before your next appointment, please call your pharmacy*  Lab Work: None  If you have labs (blood work) drawn today and your tests are completely normal, you will receive your results only by: MyChart Message (if you have MyChart) OR A paper copy in the mail If you have any lab test that is abnormal or we need to change your treatment, we will call you to review the results.  Testing/Procedures: None  Follow-Up: At [facility], you and your health needs are our priority.  As part of our continuing mission to provide you with exceptional heart care, our providers are all part of one team.  This team includes your primary Cardiologist (physician) and Advanced Practice Providers or APPs (Physician Assistants and Nurse Practitioners) who all work together to provide you with the care you need, when you need it.  Your next appointment:   1 year(s)  Provider:   Gaylyn Keas, MD    We recommend signing up for the patient portal called "MyChart".  Sign up information is provided on this After Visit Summary.  MyChart is used to connect with patients for Virtual Visits (Telemedicine).  Patients are able to view lab/test results, encounter notes, upcoming appointments, etc.  Non-urgent messages can be sent to your provider as well.   To learn more about what you can do with MyChart, go to [email or web address].

## 2023-10-05 ENCOUNTER — Ambulatory Visit (HOSPITAL_COMMUNITY): Payer: Medicare Other | Admitting: Physician Assistant

## 2023-10-06 ENCOUNTER — Ambulatory Visit (HOSPITAL_COMMUNITY): Payer: Medicare Other | Admitting: Physician Assistant

## 2023-10-17 ENCOUNTER — Other Ambulatory Visit: Payer: Self-pay | Admitting: Cardiology

## 2023-10-21 DIAGNOSIS — I480 Paroxysmal atrial fibrillation: Secondary | ICD-10-CM | POA: Diagnosis not present

## 2023-10-21 DIAGNOSIS — E7800 Pure hypercholesterolemia, unspecified: Secondary | ICD-10-CM | POA: Diagnosis not present

## 2023-10-21 DIAGNOSIS — Z79899 Other long term (current) drug therapy: Secondary | ICD-10-CM | POA: Diagnosis not present

## 2023-10-21 DIAGNOSIS — I700 Atherosclerosis of aorta: Secondary | ICD-10-CM | POA: Diagnosis not present

## 2023-10-21 DIAGNOSIS — R7303 Prediabetes: Secondary | ICD-10-CM | POA: Diagnosis not present

## 2023-10-21 DIAGNOSIS — D6869 Other thrombophilia: Secondary | ICD-10-CM | POA: Diagnosis not present

## 2023-11-14 DIAGNOSIS — H26493 Other secondary cataract, bilateral: Secondary | ICD-10-CM | POA: Diagnosis not present

## 2023-11-14 DIAGNOSIS — H50012 Monocular esotropia, left eye: Secondary | ICD-10-CM | POA: Diagnosis not present

## 2023-11-14 DIAGNOSIS — H353132 Nonexudative age-related macular degeneration, bilateral, intermediate dry stage: Secondary | ICD-10-CM | POA: Diagnosis not present

## 2023-11-14 DIAGNOSIS — H524 Presbyopia: Secondary | ICD-10-CM | POA: Diagnosis not present

## 2023-12-04 DIAGNOSIS — I4891 Unspecified atrial fibrillation: Secondary | ICD-10-CM | POA: Diagnosis not present

## 2023-12-04 DIAGNOSIS — E785 Hyperlipidemia, unspecified: Secondary | ICD-10-CM | POA: Diagnosis not present

## 2023-12-04 DIAGNOSIS — I10 Essential (primary) hypertension: Secondary | ICD-10-CM | POA: Diagnosis not present

## 2023-12-04 DIAGNOSIS — K219 Gastro-esophageal reflux disease without esophagitis: Secondary | ICD-10-CM | POA: Diagnosis not present

## 2023-12-04 DIAGNOSIS — M109 Gout, unspecified: Secondary | ICD-10-CM | POA: Diagnosis not present

## 2024-03-19 DIAGNOSIS — S3992XA Unspecified injury of lower back, initial encounter: Secondary | ICD-10-CM | POA: Diagnosis not present

## 2024-03-19 DIAGNOSIS — L821 Other seborrheic keratosis: Secondary | ICD-10-CM | POA: Diagnosis not present

## 2024-03-19 DIAGNOSIS — L608 Other nail disorders: Secondary | ICD-10-CM | POA: Diagnosis not present

## 2024-03-19 DIAGNOSIS — I4821 Permanent atrial fibrillation: Secondary | ICD-10-CM | POA: Diagnosis not present
